# Patient Record
Sex: Male | Born: 1975
Health system: Southern US, Community
[De-identification: ages and names within clinical notes are randomized; demographics above are authoritative.]

## PROBLEM LIST (undated history)

## (undated) DIAGNOSIS — K219 Gastro-esophageal reflux disease without esophagitis: Secondary | ICD-10-CM

## (undated) DIAGNOSIS — R079 Chest pain, unspecified: Secondary | ICD-10-CM

## (undated) DIAGNOSIS — E039 Hypothyroidism, unspecified: Secondary | ICD-10-CM

## (undated) DIAGNOSIS — K4090 Unilateral inguinal hernia, without obstruction or gangrene, not specified as recurrent: Secondary | ICD-10-CM

## (undated) DIAGNOSIS — G4730 Sleep apnea, unspecified: Secondary | ICD-10-CM

## (undated) DIAGNOSIS — Z87442 Personal history of urinary calculi: Secondary | ICD-10-CM

---

## 2006-11-18 HISTORY — PX: LASIK: SHX215

## 2012-09-10 ENCOUNTER — Other Ambulatory Visit: Payer: Self-pay | Admitting: Internal Medicine

## 2012-09-10 DIAGNOSIS — E039 Hypothyroidism, unspecified: Secondary | ICD-10-CM

## 2012-09-14 ENCOUNTER — Ambulatory Visit
Admission: RE | Admit: 2012-09-14 | Discharge: 2012-09-14 | Disposition: A | Discharge status: Home / Self Care | Payer: BC Managed Care – PPO | Source: Ambulatory Visit | Attending: Internal Medicine | Admitting: Internal Medicine

## 2012-09-14 DIAGNOSIS — E039 Hypothyroidism, unspecified: Secondary | ICD-10-CM

## 2013-10-26 ENCOUNTER — Encounter: Payer: Self-pay | Admitting: Internal Medicine

## 2013-10-27 ENCOUNTER — Ambulatory Visit (INDEPENDENT_AMBULATORY_CARE_PROVIDER_SITE_OTHER): Payer: BC Managed Care – PPO | Admitting: Emergency Medicine

## 2013-10-27 ENCOUNTER — Encounter: Payer: Self-pay | Admitting: Emergency Medicine

## 2013-10-27 VITALS — BP 108/62 | HR 72 | Temp 98.2°F | Resp 18 | Ht 71.0 in | Wt 175.0 lb

## 2013-10-27 DIAGNOSIS — Z79899 Other long term (current) drug therapy: Secondary | ICD-10-CM

## 2013-10-27 DIAGNOSIS — E559 Vitamin D deficiency, unspecified: Secondary | ICD-10-CM

## 2013-10-27 DIAGNOSIS — E291 Testicular hypofunction: Secondary | ICD-10-CM

## 2013-10-27 DIAGNOSIS — E782 Mixed hyperlipidemia: Secondary | ICD-10-CM

## 2013-10-27 DIAGNOSIS — E079 Disorder of thyroid, unspecified: Secondary | ICD-10-CM

## 2013-10-27 DIAGNOSIS — R300 Dysuria: Secondary | ICD-10-CM

## 2013-10-27 DIAGNOSIS — E781 Pure hyperglyceridemia: Secondary | ICD-10-CM

## 2013-10-27 DIAGNOSIS — N200 Calculus of kidney: Secondary | ICD-10-CM | POA: Insufficient documentation

## 2013-10-27 DIAGNOSIS — E039 Hypothyroidism, unspecified: Secondary | ICD-10-CM

## 2013-10-27 LAB — CBC WITH DIFFERENTIAL/PLATELET
Basophils Absolute: 0 10*3/uL (ref 0.0–0.1)
Basophils Relative: 1 % (ref 0–1)
Eosinophils Absolute: 0.1 10*3/uL (ref 0.0–0.7)
Hemoglobin: 15.8 g/dL (ref 13.0–17.0)
Lymphs Abs: 1.6 10*3/uL (ref 0.7–4.0)
MCH: 30.9 pg (ref 26.0–34.0)
MCHC: 34.4 g/dL (ref 30.0–36.0)
Monocytes Relative: 9 % (ref 3–12)
Neutro Abs: 3.4 10*3/uL (ref 1.7–7.7)
Neutrophils Relative %: 60 % (ref 43–77)
Platelets: 255 10*3/uL (ref 150–400)
RBC: 5.12 MIL/uL (ref 4.22–5.81)
WBC: 5.7 10*3/uL (ref 4.0–10.5)

## 2013-10-27 LAB — BASIC METABOLIC PANEL WITH GFR
CO2: 31 mEq/L (ref 19–32)
Calcium: 10.3 mg/dL (ref 8.4–10.5)
Chloride: 102 mEq/L (ref 96–112)
Glucose, Bld: 73 mg/dL (ref 70–99)
Sodium: 140 mEq/L (ref 135–145)

## 2013-10-27 LAB — HEPATIC FUNCTION PANEL
ALT: 39 U/L (ref 0–53)
AST: 31 U/L (ref 0–37)
Alkaline Phosphatase: 80 U/L (ref 39–117)
Bilirubin, Direct: 0.1 mg/dL (ref 0.0–0.3)

## 2013-10-27 LAB — TSH: TSH: 4.232 u[IU]/mL (ref 0.350–4.500)

## 2013-10-27 LAB — LIPID PANEL
Cholesterol: 171 mg/dL (ref 0–200)
LDL Cholesterol: 85 mg/dL (ref 0–99)
VLDL: 36 mg/dL (ref 0–40)

## 2013-10-27 LAB — URIC ACID: Uric Acid, Serum: 5.6 mg/dL (ref 4.0–7.8)

## 2013-10-27 MED ORDER — HYDROCODONE-ACETAMINOPHEN 5-325 MG PO TABS: 1.0000 | ORAL_TABLET | Freq: Four times a day (QID) | ORAL | Status: DC | PRN

## 2013-10-27 MED ORDER — TAMSULOSIN HCL 0.4 MG PO CAPS: 0.4000 mg | ORAL_CAPSULE | Freq: Every day | ORAL | Status: DC

## 2013-10-27 MED ORDER — CIPROFLOXACIN HCL 500 MG PO TABS: 500.0000 mg | ORAL_TABLET | Freq: Two times a day (BID) | ORAL | Status: AC

## 2013-10-27 NOTE — Patient Instructions (Signed)
Kidney Stones Kidney stones (urolithiasis) are solid masses that form inside your kidneys. The intense pain is caused by the stone moving through the kidney, ureter, bladder, and urethra (urinary tract). When the stone moves, the ureter starts to spasm around the stone. The stone is usually passed in your pee (urine).  HOME CARE  Drink enough fluids to keep your pee clear or pale yellow. This helps to get the stone out.  Strain all pee through the provided strainer. Do not pee without peeing through the strainer, not even once. If you pee the stone out, catch it in the strainer. The stone may be as small as a grain of salt. Take this to your doctor. This will help your doctor figure out what you can do to try to prevent more kidney stones.  Only take medicine as told by your doctor.  Follow up with your doctor as told.  Get follow-up X-rays as told by your doctor. GET HELP IF: You have pain that gets worse even if you have been taking pain medicine. GET HELP RIGHT AWAY IF:   Your pain does not get better with medicine.  You have a fever or shaking chills.  Your pain increases and gets worse over 18 hours.  You have new belly (abdominal) pain.  You feel faint or pass out.  You are unable to pee. MAKE SURE YOU:   Understand these instructions.  Will watch your condition.  Will get help right away if you are not doing well or get worse. Document Released: 04/22/2008 Document Revised: 07/07/2013 Document Reviewed: 04/07/2013 [employer] Patient Information 2014 [employer].  

## 2013-10-28 ENCOUNTER — Other Ambulatory Visit: Payer: Self-pay | Admitting: Emergency Medicine

## 2013-10-28 DIAGNOSIS — N200 Calculus of kidney: Secondary | ICD-10-CM

## 2013-10-28 LAB — URINALYSIS, ROUTINE W REFLEX MICROSCOPIC
Ketones, ur: NEGATIVE mg/dL
Leukocytes, UA: NEGATIVE
Nitrite: NEGATIVE
Protein, ur: NEGATIVE mg/dL
Urobilinogen, UA: 0.2 mg/dL (ref 0.0–1.0)

## 2013-10-28 LAB — URINALYSIS, MICROSCOPIC ONLY
Bacteria, UA: NONE SEEN
Casts: NONE SEEN
Crystals: NONE SEEN
RBC / HPF: >50 RBC/hpf — AB (ref <3)
Squamous Epithelial / LPF: NONE SEEN

## 2013-10-28 NOTE — Progress Notes (Signed)
   Subjective:    Patient ID: Ryan Lam, male    DOB: 25-Nov-1975, 37 y.o.   MRN: [id number]  HPI Comments: 37 yo male with history of kidney stones in past presents with dysuria x 1 week and mild pain. He brought a old stone with him that appears light brown and with multiple spines approximately 3-76mm. He ednies blood or fever.  He has a history of hypertriglyceridemia and has been doing well with diet and exercise. He has Hypothyroid and is controlled with RX. He also has a history of Hypogonadism but chose no RX with trying to get pregnant. He and wife are now 3 months pregnant and he would like levels rechecked. LAST ABN LABS TG 162 TESTOSTERONE 216  D 48     Current Outpatient Prescriptions on File Prior to Visit  Medication Sig Dispense Refill  . levothyroxine (SYNTHROID, LEVOTHROID) 50 MCG tablet Take 50 mcg by mouth daily before breakfast.      . Magnesium 500 MG CAPS Take 500 mg by mouth daily.      Marland Kitchen omega-3 acid ethyl esters (LOVAZA) 1 G capsule Take by mouth 2 (two) times daily.      . Zinc 25 MG TABS Take 25 mg by mouth daily.       No current facility-administered medications on file prior to visit.   ALLERGIES Penicillins  Past Medical History  Diagnosis Date  . Thyroid disease   . Hypogonadism male   . Kidney stone      Review of Systems  Genitourinary: Positive for dysuria.  All other systems reviewed and are negative.    BP 108/62  Pulse 72  Temp(Src) 98.2 F (36.8 C) (Temporal)  Resp 18  Wt 175 lb (79.379 kg)     Objective:   Physical Exam  Nursing note and vitals reviewed. Constitutional: He is oriented to person, place, and time. He appears well-developed and well-nourished.  HENT:  Head: Normocephalic and atraumatic.  Right Ear: External ear normal.  Left Ear: External ear normal.  Nose: Nose normal.  Eyes: Conjunctivae and EOM are normal.  Neck: Normal range of motion. Neck supple. No JVD present. No thyromegaly present.   Cardiovascular: Normal rate, regular rhythm, normal heart sounds and intact distal pulses.   Pulmonary/Chest: Effort normal and breath sounds normal.  Abdominal: Soft. Bowel sounds are normal. He exhibits no distension and no mass. There is tenderness. There is no rebound and no guarding.  suprapubic  Musculoskeletal: Normal range of motion. He exhibits no edema and no tenderness.  Lymphadenopathy:    He has no cervical adenopathy.  Neurological: He is alert and oriented to person, place, and time. He has normal reflexes. No cranial nerve deficit. Coordination normal.  Skin: Skin is warm and dry.  Psychiatric: He has a normal mood and affect. His behavior is normal. Judgment and thought content normal.          Assessment & Plan:  1. Dysuria with history of kidney stones- Check labs get CT renal focus may need Urology Referral. Cipro 500 mg, Flomax, Norco 5/325 All AD. 2. Hypertriglyceridemia/ Hypogonadism/ Hypothyroid- recheck labs

## 2013-11-01 ENCOUNTER — Other Ambulatory Visit: Payer: Self-pay | Admitting: Emergency Medicine

## 2013-11-01 ENCOUNTER — Ambulatory Visit
Admission: RE | Admit: 2013-11-01 | Discharge: 2013-11-01 | Disposition: A | Discharge status: Home / Self Care | Payer: BC Managed Care – PPO | Source: Ambulatory Visit | Attending: Emergency Medicine | Admitting: Emergency Medicine

## 2013-11-01 DIAGNOSIS — N200 Calculus of kidney: Secondary | ICD-10-CM

## 2013-11-02 ENCOUNTER — Other Ambulatory Visit: Payer: Self-pay | Admitting: Internal Medicine

## 2013-11-02 DIAGNOSIS — N281 Cyst of kidney, acquired: Secondary | ICD-10-CM

## 2013-11-02 DIAGNOSIS — N200 Calculus of kidney: Secondary | ICD-10-CM

## 2013-12-06 ENCOUNTER — Telehealth: Payer: Self-pay | Admitting: *Deleted

## 2013-12-06 ENCOUNTER — Other Ambulatory Visit: Payer: Self-pay | Admitting: Internal Medicine

## 2013-12-06 NOTE — Telephone Encounter (Signed)
Patient called concerned about elevated liver enzymes at urology.  Patient states he does drink alcohol and has been taking OTC meds for sinus.  Per Loree FeeMelissa Smith, reduce alcohol and increase water and return for OV in 1 month. Appointment scheduled.

## 2014-01-06 ENCOUNTER — Other Ambulatory Visit: Payer: Self-pay | Admitting: Emergency Medicine

## 2014-01-06 ENCOUNTER — Ambulatory Visit: Payer: Self-pay | Admitting: Emergency Medicine

## 2014-04-06 ENCOUNTER — Encounter: Payer: Self-pay | Admitting: Internal Medicine

## 2014-07-14 ENCOUNTER — Other Ambulatory Visit: Payer: Self-pay | Admitting: *Deleted

## 2014-07-14 NOTE — Telephone Encounter (Signed)
Patient called and advised [place] the [place] and forgot his levothyroxine 50 mcg, called # [phone]  To [facility] [phone]

## 2014-08-23 ENCOUNTER — Other Ambulatory Visit: Payer: Self-pay | Admitting: Emergency Medicine

## 2014-09-29 ENCOUNTER — Encounter: Payer: Self-pay | Admitting: Internal Medicine

## 2014-09-29 ENCOUNTER — Ambulatory Visit (INDEPENDENT_AMBULATORY_CARE_PROVIDER_SITE_OTHER): Payer: BC Managed Care – PPO | Admitting: Internal Medicine

## 2014-09-29 VITALS — BP 126/78 | HR 72 | Temp 98.1°F | Resp 16 | Ht 71.0 in | Wt 184.6 lb

## 2014-09-29 DIAGNOSIS — E349 Endocrine disorder, unspecified: Secondary | ICD-10-CM | POA: Insufficient documentation

## 2014-09-29 DIAGNOSIS — Z0001 Encounter for general adult medical examination with abnormal findings: Secondary | ICD-10-CM

## 2014-09-29 DIAGNOSIS — R6889 Other general symptoms and signs: Secondary | ICD-10-CM

## 2014-09-29 DIAGNOSIS — IMO0001 Reserved for inherently not codable concepts without codable children: Secondary | ICD-10-CM

## 2014-09-29 DIAGNOSIS — R945 Abnormal results of liver function studies: Secondary | ICD-10-CM

## 2014-09-29 DIAGNOSIS — E559 Vitamin D deficiency, unspecified: Secondary | ICD-10-CM | POA: Insufficient documentation

## 2014-09-29 DIAGNOSIS — Z1212 Encounter for screening for malignant neoplasm of rectum: Secondary | ICD-10-CM

## 2014-09-29 DIAGNOSIS — R7989 Other specified abnormal findings of blood chemistry: Secondary | ICD-10-CM

## 2014-09-29 DIAGNOSIS — N200 Calculus of kidney: Secondary | ICD-10-CM

## 2014-09-29 DIAGNOSIS — E039 Hypothyroidism, unspecified: Secondary | ICD-10-CM | POA: Insufficient documentation

## 2014-09-29 DIAGNOSIS — Z113 Encounter for screening for infections with a predominantly sexual mode of transmission: Secondary | ICD-10-CM

## 2014-09-29 DIAGNOSIS — E782 Mixed hyperlipidemia: Secondary | ICD-10-CM

## 2014-09-29 DIAGNOSIS — R030 Elevated blood-pressure reading, without diagnosis of hypertension: Secondary | ICD-10-CM

## 2014-09-29 DIAGNOSIS — Z79899 Other long term (current) drug therapy: Secondary | ICD-10-CM | POA: Insufficient documentation

## 2014-09-29 DIAGNOSIS — E038 Other specified hypothyroidism: Secondary | ICD-10-CM

## 2014-09-29 DIAGNOSIS — Z23 Encounter for immunization: Secondary | ICD-10-CM

## 2014-09-29 LAB — CBC WITH DIFFERENTIAL/PLATELET
Basophils Absolute: 0 10*3/uL (ref 0.0–0.1)
Basophils Relative: 1 % (ref 0–1)
EOS PCT: 2 % (ref 0–5)
Eosinophils Absolute: 0.1 10*3/uL (ref 0.0–0.7)
HEMATOCRIT: 43.4 % (ref 39.0–52.0)
Hemoglobin: 14.9 g/dL (ref 13.0–17.0)
Lymphocytes Relative: 32 % (ref 12–46)
Lymphs Abs: 1.5 10*3/uL (ref 0.7–4.0)
MCH: 31 pg (ref 26.0–34.0)
MCHC: 34.3 g/dL (ref 30.0–36.0)
MCV: 90.2 fL (ref 78.0–100.0)
MONO ABS: 0.5 10*3/uL (ref 0.1–1.0)
Monocytes Relative: 10 % (ref 3–12)
Neutro Abs: 2.6 10*3/uL (ref 1.7–7.7)
Neutrophils Relative %: 55 % (ref 43–77)
PLATELETS: 238 10*3/uL (ref 150–400)
RBC: 4.81 MIL/uL (ref 4.22–5.81)
RDW: 13.1 % (ref 11.5–15.5)
WBC: 4.8 10*3/uL (ref 4.0–10.5)

## 2014-09-29 NOTE — Progress Notes (Signed)
Patient ID: Ryan Lam, male   DOB: 02/15/1976, 38 y.o.   MRN: [id number]  Annual Screening Comprehensive Examination  This very nice 38 y.o.male presents for complete physical.  Patient has been monitored for HTN,  Hyperlipidemia, Testosterone and Vitamin D Deficiency. Also patient has been on Thyroid Replacement since July 2012.   Patient is monitored expectantly for HTN. Patient's BP has been controlled at home.Today's BP: 126/78 mmHg. Patient denies any cardiac symptoms as chest pain, palpitations, shortness of breath, dizziness or ankle swelling.   Patient's hyperlipidemia is controlled with diet. Last lipids were at goal with Total Chol 171; HDL  50; LDL  85; and elevated Trig 182 on 10/27/2013.   Patient is screened for prediabetes denies reactive hypoglycemic symptoms, visual blurring, diabetic polys or paresthesias. Last A1c was  5.1% in May 2013.   Patient has hx/o Low T with level of 172 in Mar 2014 and 223 in Mar 2013.  Patient's wife is in 3rd trimester of pregnancy so obviously he is functional and virile. Finally, patient has history of Vitamin D Deficiency of 56 in 2013 and last vitamin D was 60 on  10/27/2013.  Medication Sig  . VITAMIN D2000 UNITS Take 2,000 Units by mouth daily.  Awanda Mink. NORCO 5-325  Take 1 tablet  every 6 hours as needed kidney stones  . levothyroxine  50 MCG tablet 1 TAB DAILY  . Magnesium 500 MG CAPS Take  daily.  . Zinc 25 MG TABS Take  daily.   Allergies  Allergen Reactions  . Penicillins     Past Medical History  Diagnosis Date  . Thyroid disease   . Hypogonadism male   . Kidney stone    Health Maintenance  Topic Date Due  . INFLUENZA VACCINE  06/18/2014  . TETANUS/TDAP  01/15/2022   Immunization History  Administered Date(s) Administered  . Pneumococcal Polysaccharide-23 01/16/2012  . Tdap 01/16/2012   Past Surgical History  Procedure Laterality Date  . Lasik  2008   Family History  Problem Relation Age of Onset  . Cancer Mother      hx breast  . COPD Father   . AAA (abdominal aortic aneurysm) Father    History   Social History  . Marital Status: Married    Spouse Name: N/A    Number of Children: N/A  . Years of Education: N/A   Occupational History  . 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 Smoking status: Never Smoker   . Smokeless tobacco: Not on file  . Alcohol Use: 6.0 oz/week    10 Not specified per week  . Drug Use: No  . Sexual Activity: Active    ROS Constitutional: Denies fever, chills, weight loss/gain, headaches, insomnia, fatigue, night sweats or change in appetite. Eyes: Denies redness, blurred vision, diplopia, discharge, itchy or watery eyes.  ENT: Denies discharge, congestion, post nasal drip, epistaxis, sore throat, earache, hearing loss, dental pain, Tinnitus, Vertigo, Sinus pain or snoring.  Cardio: Denies chest pain, palpitations, irregular heartbeat, syncope, dyspnea, diaphoresis, orthopnea, PND, claudication or edema Respiratory: denies cough, dyspnea, DOE, pleurisy, hoarseness, laryngitis or wheezing.  Gastrointestinal: Denies dysphagia, heartburn, reflux, water brash, pain, cramps, nausea, vomiting, bloating, diarrhea, constipation, hematemesis, melena, hematochezia, jaundice or hemorrhoids Genitourinary: Denies dysuria, frequency, urgency, nocturia, hesitancy, discharge, hematuria or flank pain Musculoskeletal: Denies arthralgia, myalgia, stiffness, Jt. Swelling, pain, limp or strain/sprain. Denies Falls. Skin: Denies puritis, rash, hives, warts, acne, eczema or change in skin lesion Neuro:  No weakness, tremor, incoordination, spasms, paresthesia or pain Psychiatric: Denies confusion, memory loss or sensory loss. Denies Depression. Endocrine: Denies change in weight, skin, hair change, nocturia, and paresthesia, diabetic polys, visual blurring or hyper / hypo glycemic episodes.  Heme/Lymph: No excessive bleeding, bruising or enlarged lymph  nodes.  Physical Exam  BP 126/78   Pulse 72  Temp 98.1 F   Resp 16  Ht 5\' 11"    Wt 184 lb 9.6 oz   BMI 25.76   General Appearance: Well nourished, in no apparent distress. Eyes: PERRLA, EOMs, conjunctiva no swelling or erythema, normal fundi and vessels. Sinuses: No frontal/maxillary tenderness ENT/Mouth: EACs patent / TMs  nl. Nares clear without erythema, swelling, mucoid exudates. Oral hygiene is good. No erythema, swelling, or exudate. Tongue normal, non-obstructing. Tonsils not swollen or erythematous. Hearing normal.  Neck: Supple, thyroid normal. No bruits, nodes or JVD. Respiratory: Respiratory effort normal.  BS equal and clear bilateral without rales, rhonci, wheezing or stridor. Cardio: Heart sounds are normal with regular rate and rhythm and no murmurs, rubs or gallops. Peripheral pulses are normal and equal bilaterally without edema. No aortic or femoral bruits. Chest: symmetric with normal excursions and percussion.  Abdomen: Flat, soft, with bowl sounds. Nontender, no guarding, rebound, hernias, masses, or organomegaly.  Lymphatics: Non tender without lymphadenopathy.  Genitourinary: No hernias.Testes nl. DRE - prostate nl for age - smooth & firm w/o nodules. Musculoskeletal: Full ROM all peripheral extremities, joint stability, 5/5 strength, and normal gait. Skin: Warm and dry without rashes, lesions, cyanosis, clubbing or  ecchymosis.  Neuro: Cranial nerves intact, reflexes equal bilaterally. Normal muscle tone, no cerebellar symptoms. Sensation intact.  Pysch: Awake and oriented X 3with normal affect, insight and judgment appropriate.   Assessment and Plan  1. Annual Screening Examination 2. Screening for elevated BP 3. Hyperlipidemia (Trig) 4. Pre Diabetes (Screening) 5. Vitamin D Deficiency 6. Testosterone Deficiency   Continue prudent diet as discussed, weight control, BP monitoring, regular exercise, and medications as discussed.  Discussed med effects and  SE's. Routine screening labs and tests as requested with regular follow-up as recommended.

## 2014-09-29 NOTE — Patient Instructions (Signed)
 Recommend the book "The END of DIETING" by Dr Joel Furman   and the book "The END of DIABETES " by Dr Joel Fuhrman  At [employer] - get book & Audio CD's      Being diabetic has a  300% increased risk for heart attack, stroke, cancer, and alzheimer- type vascular dementia. It is very important that you work harder with diet by avoiding all foods that are white except chicken & fish. Avoid white rice (brown & wild rice is OK), white potatoes (sweetpotatoes in moderation is OK), White bread or wheat bread or anything made out of white flour like bagels, donuts, rolls, buns, biscuits, cakes, pastries, cookies, pizza crust, and pasta (made from white flour & egg whites) - vegetarian pasta or spinach or wheat pasta is OK. Multigrain breads like Arnold's or Pepperidge Farm, or multigrain sandwich thins or flatbreads.  Diet, exercise and weight loss can reverse and cure diabetes in the early stages.  Diet, exercise and weight loss is very important in the control and prevention of complications of diabetes which affects every system in your body, ie. Brain - dementia/stroke, eyes - glaucoma/blindness, heart - heart attack/heart failure, kidneys - dialysis, stomach - gastric paralysis, intestines - malabsorption, nerves - severe painful neuritis, circulation - gangrene & loss of a leg(s), and finally cancer and Alzheimers.    I recommend avoid fried & greasy foods,  sweets/candy, white rice (brown or wild rice or Quinoa is OK), white potatoes (sweet potatoes are OK) - anything made from white flour - bagels, doughnuts, rolls, buns, biscuits,white and wheat breads, pizza crust and traditional pasta made of white flour & egg white(vegetarian pasta or spinach or wheat pasta is OK).  Multi-grain bread is OK - like multi-grain flat bread or sandwich thins. Avoid alcohol in excess. Exercise is also important.    Eat all the vegetables you want - avoid meat, especially red meat and dairy - especially cheese.  Cheese  is the most concentrated form of trans-fats which is the worst thing to clog up our arteries. Veggie cheese is OK which can be found in the fresh produce section at [employer] or [employer] or [employer]  Preventive Care for Adults A healthy lifestyle and preventive care can promote health and wellness. Preventive health guidelines for men include the following key practices:  A routine yearly physical is a good way to check with your health care provider about your health and preventative screening. It is a chance to share any concerns and updates on your health and to receive a thorough exam.  Visit your dentist for a routine exam and preventative care every 6 months. Brush your teeth twice a day and floss once a day. Good oral hygiene prevents tooth decay and gum disease.  The frequency of eye exams is based on your age, health, family medical history, use of contact lenses, and other factors. Follow your health care provider's recommendations for frequency of eye exams.  Eat a healthy diet. Foods such as vegetables, fruits, whole grains, low-fat dairy products, and lean protein foods contain the nutrients you need without too many calories. Decrease your intake of foods high in solid fats, added sugars, and salt. Eat the right amount of calories for you.Get information about a proper diet from your health care provider, if necessary.  Regular physical exercise is one of the most important things you can do for your health. Most adults should get at least 150 minutes of moderate-intensity exercise (any activity that   increases your heart rate and causes you to sweat) each week. In addition, most adults need muscle-strengthening exercises on 2 or more days a week.  Maintain a healthy weight. The body mass index (BMI) is a screening tool to identify possible weight problems. It provides an estimate of body fat based on height and weight. Your health care provider can find your BMI and can help you  achieve or maintain a healthy weight.For adults 20 years and older:  A BMI below 18.5 is considered underweight.  A BMI of 18.5 to 24.9 is normal.  A BMI of 25 to 29.9 is considered overweight.  A BMI of 30 and above is considered obese.  Maintain normal blood lipids and cholesterol levels by exercising and minimizing your intake of saturated fat. Eat a balanced diet with plenty of fruit and vegetables. Blood tests for lipids and cholesterol should begin at age 20 and be repeated every 5 years. If your lipid or cholesterol levels are high, you are over 50, or you are at high risk for heart disease, you may need your cholesterol levels checked more frequently.Ongoing high lipid and cholesterol levels should be treated with medicines if diet and exercise are not working.  If you smoke, find out from your health care provider how to quit. If you do not use tobacco, do not start.  Lung cancer screening is recommended for adults aged 55-80 years who are at high risk for developing lung cancer because of a history of smoking. A yearly low-dose CT scan of the lungs is recommended for people who have at least a 30-pack-year history of smoking and are a current smoker or have quit within the past 15 years. A pack year of smoking is smoking an average of 1 pack of cigarettes a day for 1 year (for example: 1 pack a day for 30 years or 2 packs a day for 15 years). Yearly screening should continue until the smoker has stopped smoking for at least 15 years. Yearly screening should be stopped for people who develop a health problem that would prevent them from having lung cancer treatment.  If you choose to drink alcohol, do not have more than 2 drinks per day. One drink is considered to be 12 ounces (355 mL) of beer, 5 ounces (148 mL) of wine, or 1.5 ounces (44 mL) of liquor.  High blood pressure causes heart disease and increases the risk of stroke. Your blood pressure should be checked. Ongoing high blood  pressure should be treated with medicines, if weight loss and exercise are not effective.  If you are 45-79 years old, ask your health care provider if you should take aspirin to prevent heart disease.  Diabetes screening involves taking a blood sample to check your fasting blood sugar level. Testing should be considered at a younger age or be carried out more frequently if you are overweight and have at least 1 risk factor for diabetes.  Colorectal cancer can be detected and often prevented. Most routine colorectal cancer screening begins at the age of 50 and continues through age 75. However, your health care provider may recommend screening at an earlier age if you have risk factors for colon cancer. On a yearly basis, your health care provider may provide home test kits to check for hidden blood in the stool. Use of a small camera at the end of a tube to directly examine the colon (sigmoidoscopy or colonoscopy) can detect the earliest forms of colorectal cancer. Talk to your   health care provider about this at age 50, when routine screening begins. Direct exam of the colon should be repeated every 5-10 years through age 75, unless early forms of precancerous polyps or small growths are found.  Screening for abdominal aortic aneurysm (AAA)  are recommended for persons over age 50 who have history of hypertensionor who are current or former smokers.  Talk with your health care provider about prostate cancer screening.  Testicular cancer screening is recommended for adult males. Screening includes self-exam, a health care provider exam, and other screening tests. Consult with your health care provider about any symptoms you have or any concerns you have about testicular cancer.  Use sunscreen. Apply sunscreen liberally and repeatedly throughout the day. You should seek shade when your shadow is shorter than you. Protect yourself by wearing long sleeves, pants, a wide-brimmed hat, and sunglasses year  round, whenever you are outdoors.  Once a month, do a whole-body skin exam, using a mirror to look at the skin on your back. Tell your health care provider about new moles, moles that have irregular borders, moles that are larger than a pencil eraser, or moles that have changed in shape or color.  Stay current with required vaccines (immunizations).  Influenza vaccine. All adults should be immunized every year.  Tetanus, diphtheria, and acellular pertussis (Td, Tdap) vaccine. An adult who has not previously received Tdap or who does not know his vaccine status should receive 1 dose of Tdap. This initial dose should be followed by tetanus and diphtheria toxoids (Td) booster doses every 10 years. Adults with an unknown or incomplete history of completing a 3-dose immunization series with Td-containing vaccines should begin or complete a primary immunization series including a Tdap dose. Adults should receive a Td booster every 10 years.  Zoster vaccine. One dose is recommended for adults aged 60 years or older unless certain conditions are present.    Pneumococcal 13-valent conjugate (PCV13) vaccine. When indicated, a person who is uncertain of his immunization history and has no record of immunization should receive the PCV13 vaccine. An adult aged 19 years or older who has certain medical conditions and has not been previously immunized should receive 1 dose of PCV13 vaccine. This PCV13 should be followed with a dose of pneumococcal polysaccharide (PPSV23) vaccine. The PPSV23 vaccine dose should be obtained at least 8 weeks after the dose of PCV13 vaccine. An adult aged 19 years or older who has certain medical conditions and previously received 1 or more doses of PPSV23 vaccine should receive 1 dose of PCV13. The PCV13 vaccine dose should be obtained 1 or more years after the last PPSV23 vaccine dose.    Pneumococcal polysaccharide (PPSV23) vaccine. When PCV13 is also indicated, PCV13 should be  obtained first. All adults aged 65 years and older should be immunized. An adult younger than age 65 years who has certain medical conditions should be immunized. Any person who resides in a nursing home or long-term care facility should be immunized. An adult smoker should be immunized. People with an immunocompromised condition and certain other conditions should receive both PCV13 and PPSV23 vaccines. People with human immunodeficiency virus (HIV) infection should be immunized as soon as possible after diagnosis. Immunization during chemotherapy or radiation therapy should be avoided. Routine use of PPSV23 vaccine is not recommended for American Indians, [place] Natives, or people younger than 65 years unless there are medical conditions that require PPSV23 vaccine. When indicated, people who have unknown immunization and have no record   of immunization should receive PPSV23 vaccine. One-time revaccination 5 years after the first dose of PPSV23 is recommended for people aged 19-64 years who have chronic kidney failure, nephrotic syndrome, asplenia, or immunocompromised conditions. People who received 1-2 doses of PPSV23 before age 65 years should receive another dose of PPSV23 vaccine at age 65 years or later if at least 5 years have passed since the previous dose. Doses of PPSV23 are not needed for people immunized with PPSV23 at or after age 65 years.  Hepatitis A vaccine. Adults who wish to be protected from this disease, have certain high-risk conditions, work with hepatitis A-infected animals, work in hepatitis A research labs, or travel to or work in countries with a high rate of hepatitis A should be immunized. Adults who were previously unvaccinated and who anticipate close contact with an international adoptee during the first 60 days after arrival in the [place] from a country with a high rate of hepatitis A should be immunized.  Hepatitis B vaccine. Adults should be immunized if they wish to be  protected from this disease, have certain high-risk conditions, may be exposed to blood or other infectious body fluids, are household contacts or sex partners of hepatitis B positive people, are clients or workers in certain care facilities, or travel to or work in countries with a high rate of hepatitis B.  Preventive Service / Frequency  Ages 19 to 39  Blood pressure check.  Lipid and cholesterol check.  Hepatitis C blood test.** / For any individual with known risks for hepatitis C.  Skin self-exam. / Monthly.  Influenza vaccine. / Every year.  Tetanus, diphtheria, and acellular pertussis (Tdap, Td) vaccine.** / Consult your health care provider. 1 dose of Td every 10 years.  HPV vaccine. / 3 doses over 6 months, if 26 or younger.  Measles, mumps, rubella (MMR) vaccine.** / You need at least 1 dose of MMR if you were born in 1957 or later. You may also need a second dose.  Pneumococcal 13-valent conjugate (PCV13) vaccine.** / Consult your health care provider.  Pneumococcal polysaccharide (PPSV23) vaccine.** / 1 to 2 doses if you smoke cigarettes or if you have certain conditions.  Meningococcal vaccine.** / 1 dose if you are age 19 to 21 years and a first-year college student living in a residence hall, or have one of several medical conditions. You may also need additional booster doses.  Hepatitis A vaccine.** / Consult your health care provider.  Hepatitis B vaccine.** / Consult your health care provider. 

## 2014-09-30 ENCOUNTER — Encounter: Payer: Self-pay | Admitting: Internal Medicine

## 2014-09-30 DIAGNOSIS — Z23 Encounter for immunization: Secondary | ICD-10-CM

## 2014-09-30 LAB — HEPATIC FUNCTION PANEL
ALK PHOS: 75 U/L (ref 39–117)
ALT: 41 U/L (ref 0–53)
AST: 33 U/L (ref 0–37)
Albumin: 4.3 g/dL (ref 3.5–5.2)
Bilirubin, Direct: 0.1 mg/dL (ref 0.0–0.3)
Indirect Bilirubin: 0.3 mg/dL (ref 0.2–1.2)
TOTAL PROTEIN: 7.3 g/dL (ref 6.0–8.3)
Total Bilirubin: 0.4 mg/dL (ref 0.2–1.2)

## 2014-09-30 LAB — BASIC METABOLIC PANEL WITH GFR
BUN: 14 mg/dL (ref 6–23)
CALCIUM: 9.7 mg/dL (ref 8.4–10.5)
CHLORIDE: 103 meq/L (ref 96–112)
CO2: 32 mEq/L (ref 19–32)
CREATININE: 1 mg/dL (ref 0.50–1.35)
GFR, Est African American: >89 mL/min
Glucose, Bld: 86 mg/dL (ref 70–99)
Potassium: 4.4 mEq/L (ref 3.5–5.3)
Sodium: 140 mEq/L (ref 135–145)

## 2014-09-30 LAB — VITAMIN B12: VITAMIN B 12: 744 pg/mL (ref 211–911)

## 2014-09-30 LAB — URINALYSIS, MICROSCOPIC ONLY
Bacteria, UA: NONE SEEN
CRYSTALS: NONE SEEN
Casts: NONE SEEN
Squamous Epithelial / LPF: NONE SEEN

## 2014-09-30 LAB — LIPID PANEL
Cholesterol: 159 mg/dL (ref 0–200)
HDL: 48 mg/dL (ref >39)
LDL Cholesterol: 86 mg/dL (ref 0–99)
TRIGLYCERIDES: 124 mg/dL (ref <150)
Total CHOL/HDL Ratio: 3.3 Ratio
VLDL: 25 mg/dL (ref 0–40)

## 2014-09-30 LAB — INSULIN, FASTING: INSULIN FASTING, SERUM: 18.6 u[IU]/mL (ref 2.0–19.6)

## 2014-09-30 LAB — MICROALBUMIN / CREATININE URINE RATIO
Creatinine, Urine: 178.2 mg/dL
MICROALB/CREAT RATIO: 7.9 mg/g (ref 0.0–30.0)
Microalb, Ur: 1.4 mg/dL (ref <2.0)

## 2014-09-30 LAB — HIV ANTIBODY (ROUTINE TESTING W REFLEX): HIV 1&2 Ab, 4th Generation: NONREACTIVE

## 2014-09-30 LAB — MAGNESIUM: MAGNESIUM: 2.1 mg/dL (ref 1.5–2.5)

## 2014-09-30 LAB — VITAMIN D 25 HYDROXY (VIT D DEFICIENCY, FRACTURES): VIT D 25 HYDROXY: 58 ng/mL (ref 30–89)

## 2014-09-30 LAB — HEPATITIS B SURFACE ANTIBODY,QUALITATIVE: Hep B S Ab: NEGATIVE

## 2014-09-30 LAB — HEMOGLOBIN A1C
Hgb A1c MFr Bld: 5 % (ref <5.7)
MEAN PLASMA GLUCOSE: 97 mg/dL (ref <117)

## 2014-09-30 LAB — TSH: TSH: 3.337 u[IU]/mL (ref 0.350–4.500)

## 2014-09-30 LAB — TESTOSTERONE: Testosterone: 127 ng/dL — ABNORMAL LOW (ref 300–890)

## 2014-09-30 LAB — RPR

## 2014-09-30 LAB — HEPATITIS A ANTIBODY, TOTAL: Hep A Total Ab: NONREACTIVE

## 2014-09-30 LAB — HEPATITIS C ANTIBODY: HCV Ab: NEGATIVE

## 2014-09-30 LAB — HEPATITIS B CORE ANTIBODY, TOTAL: HEP B C TOTAL AB: NONREACTIVE

## 2014-10-01 ENCOUNTER — Encounter: Payer: Self-pay | Admitting: Internal Medicine

## 2014-10-03 LAB — HEPATITIS B E ANTIBODY: Hepatitis Be Antibody: NONREACTIVE

## 2014-10-08 ENCOUNTER — Other Ambulatory Visit: Payer: Self-pay | Admitting: Physician Assistant

## 2014-10-19 ENCOUNTER — Other Ambulatory Visit: Payer: Self-pay | Admitting: *Deleted

## 2014-10-19 DIAGNOSIS — Z1212 Encounter for screening for malignant neoplasm of rectum: Secondary | ICD-10-CM

## 2014-10-19 LAB — POC HEMOCCULT BLD/STL (HOME/3-CARD/SCREEN)
Card #2 Fecal Occult Blod, POC: NEGATIVE
Card #3 Fecal Occult Blood, POC: NEGATIVE
Fecal Occult Blood, POC: NEGATIVE

## 2015-04-10 ENCOUNTER — Encounter: Payer: Self-pay | Admitting: Internal Medicine

## 2015-10-17 ENCOUNTER — Encounter: Payer: Self-pay | Admitting: Internal Medicine

## 2015-10-23 ENCOUNTER — Ambulatory Visit: Payer: Self-pay | Admitting: Internal Medicine

## 2015-10-23 NOTE — Progress Notes (Signed)
Patient ID: Ryan Lam, male   DOB: 07/28/1976, 39 y.o.   MRN: [id number] [facility]

## 2015-12-11 ENCOUNTER — Ambulatory Visit (INDEPENDENT_AMBULATORY_CARE_PROVIDER_SITE_OTHER): Payer: BLUE CROSS/BLUE SHIELD | Admitting: Internal Medicine

## 2015-12-11 ENCOUNTER — Encounter: Payer: Self-pay | Admitting: Internal Medicine

## 2015-12-11 VITALS — BP 118/86 | HR 72 | Temp 97.5°F | Resp 16 | Ht 71.0 in | Wt 185.0 lb

## 2015-12-11 DIAGNOSIS — Z0001 Encounter for general adult medical examination with abnormal findings: Secondary | ICD-10-CM

## 2015-12-11 DIAGNOSIS — E559 Vitamin D deficiency, unspecified: Secondary | ICD-10-CM

## 2015-12-11 DIAGNOSIS — E039 Hypothyroidism, unspecified: Secondary | ICD-10-CM

## 2015-12-11 DIAGNOSIS — Z111 Encounter for screening for respiratory tuberculosis: Secondary | ICD-10-CM | POA: Diagnosis not present

## 2015-12-11 DIAGNOSIS — IMO0001 Reserved for inherently not codable concepts without codable children: Secondary | ICD-10-CM

## 2015-12-11 DIAGNOSIS — R7309 Other abnormal glucose: Secondary | ICD-10-CM

## 2015-12-11 DIAGNOSIS — Z1212 Encounter for screening for malignant neoplasm of rectum: Secondary | ICD-10-CM

## 2015-12-11 DIAGNOSIS — E349 Endocrine disorder, unspecified: Secondary | ICD-10-CM

## 2015-12-11 DIAGNOSIS — I10 Essential (primary) hypertension: Secondary | ICD-10-CM | POA: Insufficient documentation

## 2015-12-11 DIAGNOSIS — Z79899 Other long term (current) drug therapy: Secondary | ICD-10-CM

## 2015-12-11 DIAGNOSIS — R5383 Other fatigue: Secondary | ICD-10-CM

## 2015-12-11 DIAGNOSIS — E782 Mixed hyperlipidemia: Secondary | ICD-10-CM

## 2015-12-11 DIAGNOSIS — Z0000 Encounter for general adult medical examination without abnormal findings: Secondary | ICD-10-CM

## 2015-12-11 DIAGNOSIS — R030 Elevated blood-pressure reading, without diagnosis of hypertension: Secondary | ICD-10-CM

## 2015-12-11 LAB — CBC WITH DIFFERENTIAL/PLATELET
Basophils Absolute: 0 10*3/uL (ref 0.0–0.1)
Basophils Relative: 1 % (ref 0–1)
Eosinophils Absolute: 0.1 10*3/uL (ref 0.0–0.7)
Eosinophils Relative: 2 % (ref 0–5)
HEMATOCRIT: 44.2 % (ref 39.0–52.0)
HEMOGLOBIN: 14.8 g/dL (ref 13.0–17.0)
LYMPHS ABS: 1.5 10*3/uL (ref 0.7–4.0)
LYMPHS PCT: 36 % (ref 12–46)
MCH: 30.9 pg (ref 26.0–34.0)
MCHC: 33.5 g/dL (ref 30.0–36.0)
MCV: 92.3 fL (ref 78.0–100.0)
MONOS PCT: 17 % — AB (ref 3–12)
MPV: 10.3 fL (ref 8.6–12.4)
Monocytes Absolute: 0.7 10*3/uL (ref 0.1–1.0)
NEUTROS ABS: 1.8 10*3/uL (ref 1.7–7.7)
NEUTROS PCT: 44 % (ref 43–77)
Platelets: 215 10*3/uL (ref 150–400)
RBC: 4.79 MIL/uL (ref 4.22–5.81)
RDW: 13.3 % (ref 11.5–15.5)
WBC: 4.2 10*3/uL (ref 4.0–10.5)

## 2015-12-11 MED ORDER — FISH OIL 1200 MG PO CAPS: ORAL_CAPSULE | ORAL | Status: DC

## 2015-12-11 NOTE — Progress Notes (Signed)
Patient ID: Ryan Lam, male   DOB: [birth year], 40 y.o.   MRN: [id number]  Annual  Screening/Preventative Visit And Comprehensive Evaluation & Examination      This very nice 40 y.o. MWM presents for presents for a Wellness/Preventative Visit & comprehensive evaluation and management of multiple medical co-morbidities.  Patient is being screened for elevated BP,  Prediabetes, Hyperlipidemia, Low Testosterone, Thyroid  and Vitamin D Deficiency.      Patient has had borderline elevated BP's in th past and today's BP: 118/86 mmHg. Patient denies any cardiac symptoms as chest pain, palpitations, shortness of breath, dizziness or ankle swelling.      Patient's hyperlipidemia is controlled with diet and medications. Patient denies myalgias or other medication SE's. Last lipids were Total Chol 159, HDL 48, Trig 124 & LDL 86 in Nov 2015.      Patient is screened proactively for prediabetes and patient denies reactive hypoglycemic symptoms, visual blurring, diabetic polys or paresthesias. Last A1c was 5.0% in Nov 2015.        Patient has been on thyroid replacement since July 2013. Patient has several yr hx/o Low Testosterone with values ranging from 172, 158 to 127 last Nov 2015. He denies any sx's consequent of low testosterone as fatigue, decreased libido, ED or mood changes as depression and thus has deferred replacement therapy.  Finally, patient has history of Vitamin D Deficiency and last vitamin D was 52 in Nov 2015.   Medication Sig  . VITAMIN D 2000 UNITS  Take 2,000 Units by mouth daily.  Marland Kitchen levothyroxine 50 MCG tablet Take 1 tablet daily on an empty stomach for 30 minutes  . Magnesium 500 MG CAPS Take 500 mg by mouth daily.   OTC Omega 3 Fish Oil  Takes 2 to 3 x/week  . Zinc 25 MG TABS Take 25 mg by mouth daily.   Allergies  Allergen Reactions  . Penicillins    Past Medical History  Diagnosis Date  . Thyroid disease   . Hypogonadism male   . Kidney stone    Health Maintenance   Topic Date Due  . INFLUENZA VACCINE  06/19/2015  . TETANUS/TDAP  01/15/2022  . HIV Screening  Completed   Immunization History  Administered Date(s) Administered  . Influenza Split 09/30/2014  . Pneumococcal Polysaccharide-23 01/16/2012  . Tdap 01/16/2012   Past Surgical History  Procedure Laterality Date  . Lasik  2008   Family History  Problem Relation Age of Onset  . Cancer Mother     hx breast  . COPD Father   . AAA (abdominal aortic aneurysm) Father     Social History   Social History  . Marital Status: Married    Spouse Name: N/A  . Number of Children: 1 daughter 94 yo & wife 6 mo Gravid w/a 2sd daughter  . Years of Education: N/A   Occupational History  . Barrister's clerk   Social History Main Topics  . Smoking status: Never Smoker   . Smokeless tobacco: Not on file  . Alcohol Use: 6.0 oz/week    10 Standard drinks or equivalent per week  . Drug Use: No  . Sexual Activity: QActive     ROS Constitutional: Denies fever, chills, weight loss/gain, headaches, insomnia,  night sweats or change in appetite. Does c/o fatigue. Eyes: Denies redness, blurred vision, diplopia, discharge, itchy or watery eyes.  ENT: Denies discharge, congestion, post nasal drip, epistaxis, sore throat, earache, hearing loss, dental pain, Tinnitus,  Vertigo, Sinus pain or snoring.  Cardio: Denies chest pain, palpitations, irregular heartbeat, syncope, dyspnea, diaphoresis, orthopnea, PND, claudication or edema Respiratory: denies cough, dyspnea, DOE, pleurisy, hoarseness, laryngitis or wheezing.  Gastrointestinal: Denies dysphagia, heartburn, reflux, water brash, pain, cramps, nausea, vomiting, bloating, diarrhea, constipation, hematemesis, melena, hematochezia, jaundice or hemorrhoids Genitourinary: Denies dysuria, frequency, urgency, nocturia, hesitancy, discharge, hematuria or flank pain Musculoskeletal: Denies arthralgia, myalgia, stiffness, Jt. Swelling, pain,  limp or strain/sprain. Denies Falls. Skin: Denies puritis, rash, hives, warts, acne, eczema or change in skin lesion Neuro: No weakness, tremor, incoordination, spasms, paresthesia or pain Psychiatric: Denies confusion, memory loss or sensory loss. Denies Depression. Endocrine: Denies change in weight, skin, hair change, nocturia, and paresthesia, diabetic polys, visual blurring or hyper / hypo glycemic episodes.  Heme/Lymph: No excessive bleeding, bruising or enlarged lymph nodes.  Physical Exam  BP 118/86 mmHg  Pulse 72  Temp(Src) 97.5 F (36.4 C)  Resp 16  Ht  (1.803 m)  Wt 185 lb (83.915 kg)  BMI 25.81 kg/m2  General Appearance: Well nourished, in no apparent distress. Eyes: PERRLA, EOMs, conjunctiva no swelling or erythema, normal fundi and vessels. Sinuses: No frontal/maxillary tenderness ENT/Mouth: EACs patent / TMs  nl. Nares clear without erythema, swelling, mucoid exudates. Oral hygiene is good. No erythema, swelling, or exudate. Tongue normal, non-obstructing. Tonsils not swollen or erythematous. Hearing normal.  Neck: Supple, thyroid normal. No bruits, nodes or JVD. Respiratory: Respiratory effort normal.  BS equal and clear bilateral without rales, rhonci, wheezing or stridor. Cardio: Heart sounds are normal with regular rate and rhythm and no murmurs, rubs or gallops. Peripheral pulses are normal and equal bilaterally without edema. No aortic or femoral bruits. Chest: symmetric with normal excursions and percussion.  Abdomen: Soft, with Nl bowel sounds. Nontender, no guarding, rebound, hernias, masses, or organomegaly.  Lymphatics: Non tender without lymphadenopathy.  Genitourinary: No hernias.Testes nl. DRE - prostate nl for age - smooth & firm w/o nodules. Musculoskeletal: Full ROM all peripheral extremities, joint stability, 5/5 strength, and normal gait. Skin: Warm and dry without rashes, lesions, cyanosis, clubbing or  ecchymosis.  Neuro: Cranial nerves intact,  reflexes equal bilaterally. Normal muscle tone, no cerebellar symptoms. Sensation intact.  Pysch: Alert and oriented X 3 with normal affect, insight and judgment appropriate.   Assessment and Plan  1. Annual Preventative/Screening Exam   - Microalbumin / creatinine urine ratio - EKG 12-Lead - POC Hemoccult Bld/Stl  - Urinalysis, Routine w reflex microscopic  - Vitamin B12 - Iron and TIBC - Testosterone - CBC with Differential/Platelet - BASIC METABOLIC PANEL WITH GFR - Hepatic function panel - Magnesium - Lipid panel - TSH - Hemoglobin A1c - Insulin, random - VITAMIN D 25 Hydroxy   2. Elevated BP  - Microalbumin / creatinine urine ratio - EKG 12-Lead - TSH  3. Mixed hyperlipidemia  - Lipid panel - TSH  4. Vitamin D deficiency  - VITAMIN D 25 Hydroxy   5. Testosterone deficiency  - Testosterone  6. Hypothyroidism   7. Screening for rectal cancer  - POC Hemoccult Bld/Stl  8. Other fatigue  - Vitamin B12 - Iron and TIBC - Testosterone - TSH  9. Other abnormal glucose  - Hemoglobin A1c - Insulin, random  10. Medication management  - Urinalysis, Routine w reflex microscopic  - CBC with Differential/Platelet - BASIC METABOLIC PANEL WITH GFR - Hepatic function panel - Magnesium   Continue prudent diet as discussed, weight control, BP monitoring, regular exercise, and medications as discussed.  Discussed med effects and SE's. Routine screening labs and tests as requested with regular follow-up as recommended. Over 40 minutes of exam, counseling, chart review and high complex critical decision making was performed

## 2015-12-11 NOTE — Patient Instructions (Addendum)
Recommend Adult Low Dose Aspirin or   coated  Aspirin 81 mg daily   To reduce risk of Colon Cancer 20 %,   Skin Cancer 26 % ,   Melanoma 46%   and   Pancreatic cancer 60%   ++++++++++++++++++++++++++++++++++++++++++++++++++++++  Vitamin D goal   is between 70-100.   Please make sure that you are taking your Vitamin D as directed.   It is very important as a natural anti-inflammatory   helping hair, skin, and nails, as well as reducing stroke and heart attack risk.   It helps your bones and helps with mood.  It also decreases numerous cancer risks so please take it as directed.   Low Vit D is associated with a 200-300% higher risk for CANCER   and 200-300% higher risk for HEART   ATTACK  &  STROKE.   ......................................  It is also associated with higher death rate at younger ages,   autoimmune diseases like Rheumatoid arthritis, Lupus, Multiple Sclerosis.     Also many other serious conditions, like depression, Alzheimer's  Dementia, infertility, muscle aches, fatigue, fibromyalgia - just to name a few.  ++++++++++++++++++++++++++++++++++++++++++++++++  Recommend the book "The END of DIETING" by Dr Joel Fuhrman   & the book "The END of DIABETES " by Dr Joel Fuhrman  At [employer] - get book & Audio CD's     Being diabetic has a  300% increased risk for heart attack, stroke, cancer, and alzheimer- type vascular dementia. It is very important that you work harder with diet by avoiding all foods that are white. Avoid white rice (brown & wild rice is OK), white potatoes (sweetpotatoes in moderation is OK), White bread or wheat bread or anything made out of white flour like bagels, donuts, rolls, buns, biscuits, cakes, pastries, cookies, pizza crust, and pasta (made from white flour & egg whites) - vegetarian pasta or spinach or wheat pasta is OK. Multigrain breads like Arnold's or [employer], or multigrain sandwich thins or flatbreads.  Diet,  exercise and weight loss can reverse and cure diabetes in the early stages.  Diet, exercise and weight loss is very important in the control and prevention of complications of diabetes which affects every system in your body, ie. Brain - dementia/stroke, eyes - glaucoma/blindness, heart - heart attack/heart failure, kidneys - dialysis, stomach - gastric paralysis, intestines - malabsorption, nerves - severe painful neuritis, circulation - gangrene & loss of a leg(s), and finally cancer and Alzheimers.    I recommend avoid fried & greasy foods,  sweets/candy, white rice (brown or wild rice or Quinoa is OK), white potatoes (sweet potatoes are OK) - anything made from white flour - bagels, doughnuts, rolls, buns, biscuits,white and wheat breads, pizza crust and traditional pasta made of white flour & egg white(vegetarian pasta or spinach or wheat pasta is OK).  Multi-grain bread is OK - like multi-grain flat bread or sandwich thins. Avoid alcohol in excess. Exercise is also important.    Eat all the vegetables you want - avoid meat, especially red meat and dairy - especially cheese.  Cheese is the most concentrated form of trans-fats which is the worst thing to clog up our arteries. Veggie cheese is OK which can be found in the fresh produce section at [employer] or [employer] or [employer]  ++++++++++++++++++++++++++++++++++++++++++++++++++ DASH Eating Plan  DASH stands for "Dietary Approaches to Stop Hypertension."   The DASH eating plan is a healthy eating plan that has been shown to reduce high   blood pressure (hypertension). Additional health benefits may include reducing the risk of type 2 diabetes mellitus, heart disease, and stroke. The DASH eating plan may also help with weight loss.  WHAT DO I NEED TO KNOW ABOUT THE DASH EATING PLAN? For the DASH eating plan, you will follow these general guidelines:  Choose foods with a percent daily value for sodium of less than 5% (as listed on the food  label).  Use salt-free seasonings or herbs instead of table salt or sea salt.  Check with your health care provider or pharmacist before using salt substitutes.  Eat lower-sodium products, often labeled as "lower sodium" or "no salt added."  Eat fresh foods.  Eat more vegetables, fruits, and low-fat dairy products.    Choose whole grains. Look for the word "whole" as the first word in the ingredient list.  Choose fish   Limit sweets, desserts, sugars, and sugary drinks.  Choose heart-healthy fats.  Eat veggie cheese   Eat more home-cooked food and less restaurant, buffet, and fast food.  Limit fried foods.  Cook foods using methods other than frying.  Limit canned vegetables. If you do use them, rinse them well to decrease the sodium.  When eating at a restaurant, ask that your food be prepared with less salt, or no salt if possible.                      WHAT FOODS CAN I EAT?  Seek help from a dietitian for individual calorie needs.  Grains Whole grain or whole wheat bread. Brown rice. Whole grain or whole wheat pasta. Quinoa, bulgur, and whole grain cereals. Low-sodium cereals. Corn or whole wheat flour tortillas. Whole grain cornbread. Whole grain crackers. Low-sodium crackers.  Vegetables Fresh or frozen vegetables (raw, steamed, roasted, or grilled). Low-sodium or reduced-sodium tomato and vegetable juices. Low-sodium or reduced-sodium tomato sauce and paste. Low-sodium or reduced-sodium canned vegetables.   Fruits All fresh, canned (in natural juice), or frozen fruits.  Protein Products  All fish and seafood.  Dried beans, peas, or lentils. Unsalted nuts and seeds. Unsalted canned beans.  Dairy Low-fat dairy products, such as skim or 1% milk, 2% or reduced-fat cheeses, low-fat ricotta or cottage cheese, or plain low-fat yogurt. Low-sodium or reduced-sodium cheeses.  Fats and Oils Tub margarines without trans fats. Light or reduced-fat mayonnaise and salad  dressings (reduced sodium). Avocado. Safflower, olive, or canola oils. Natural peanut or almond butter.  Other Unsalted popcorn and pretzels. The items listed above may not be a complete list of recommended foods or beverages. Contact your dietitian for more options.  +++++++++++++++++++++++++++++++++++++++++++  WHAT FOODS ARE NOT RECOMMENDED?  Grains/ White flour or wheat flour  White bread. White pasta. White rice. Refined cornbread. Bagels and croissants. Crackers that contain trans fat.  Vegetables  Creamed or fried vegetables. Vegetables in a . Regular canned vegetables. Regular canned tomato sauce and paste. Regular tomato and vegetable juices.  Fruits Dried fruits. Canned fruit in light or heavy syrup. Fruit juice.  Meat and Other Protein Products Meat in general. Fatty cuts of meat. Ribs, chicken wings, bacon, sausage, bologna, salami, chitterlings, fatback, hot dogs, bratwurst, and packaged luncheon meats.  Dairy Whole or 2% milk, cream, half-and-half, and cream cheese. Whole-fat or sweetened yogurt. Full-fat cheeses or blue cheese. Nondairy creamers and whipped toppings. Processed cheese, cheese spreads, or cheese curds.  Condiments Onion and garlic salt, seasoned salt, table salt, and sea salt. Canned and packaged gravies. Worcestershire sauce. Tartar sauce.  Barbecue sauce. Teriyaki sauce. Soy sauce, including reduced sodium. Steak sauce. Fish sauce. Oyster sauce. Cocktail sauce. Horseradish. Ketchup and mustard. Meat flavorings and tenderizers. Bouillon cubes. Hot sauce. Tabasco sauce. Marinades. Taco seasonings. Relishes.  Fats and Oils Butter, stick margarine, lard, shortening, ghee, and bacon fat. Coconut, palm kernel, or palm oils. Regular salad dressings.  Pickles and olives. Salted popcorn and pretzels. The items listed above may not be a complete list of foods and beverages to avoid.   Preventive Care for Adults  A healthy lifestyle and preventive care can  promote health and wellness. Preventive health guidelines for men include the following key practices:  A routine yearly physical is a good way to check with your health care provider about your health and preventative screening. It is a chance to share any concerns and updates on your health and to receive a thorough exam.  Visit your dentist for a routine exam and preventative care every 6 months. Brush your teeth twice a day and floss once a day. Good oral hygiene prevents tooth decay and gum disease.  The frequency of eye exams is based on your age, health, family medical history, use of contact lenses, and other factors. Follow your health care provider's recommendations for frequency of eye exams.  Eat a healthy diet. Foods such as vegetables, fruits, whole grains, low-fat dairy products, and lean protein foods contain the nutrients you need without too many calories. Decrease your intake of foods high in solid fats, added sugars, and salt. Eat the right amount of calories for you.Get information about a proper diet from your health care provider, if necessary.  Regular physical exercise is one of the most important things you can do for your health. Most adults should get at least 150 minutes of moderate-intensity exercise (any activity that increases your heart rate and causes you to sweat) each week. In addition, most adults need muscle-strengthening exercises on 2 or more days a week.  Maintain a healthy weight. The body mass index (BMI) is a screening tool to identify possible weight problems. It provides an estimate of body fat based on height and weight. Your health care provider can find your BMI and can help you achieve or maintain a healthy weight.For adults 20 years and older:  A BMI below 18.5 is considered underweight.  A BMI of 18.5 to 24.9 is normal.  A BMI of 25 to 29.9 is considered overweight.  A BMI of 30 and above is considered obese.  Maintain normal blood lipids  and cholesterol levels by exercising and minimizing your intake of saturated fat. Eat a balanced diet with plenty of fruit and vegetables. Blood tests for lipids and cholesterol should begin at age 68 and be repeated every 5 years. If your lipid or cholesterol levels are high, you are over 50, or you are at high risk for heart disease, you may need your cholesterol levels checked more frequently.Ongoing high lipid and cholesterol levels should be treated with medicines if diet and exercise are not working.  If you smoke, find out from your health care provider how to quit. If you do not use tobacco, do not start.  Lung cancer screening is recommended for adults aged 77-80 years who are at high risk for developing lung cancer because of a history of smoking. A yearly low-dose CT scan of the lungs is recommended for people who have at least a 30-pack-year history of smoking and are a current smoker or have quit within the past 15  years. A pack year of smoking is smoking an average of 1 pack of cigarettes a day for 1 year (for example: 1 pack a day for 30 years or 2 packs a day for 15 years). Yearly screening should continue until the smoker has stopped smoking for at least 15 years. Yearly screening should be stopped for people who develop a health problem that would prevent them from having lung cancer treatment.  If you choose to drink alcohol, do not have more than 2 drinks per day. One drink is considered to be 12 ounces (355 mL) of beer, 5 ounces (148 mL) of wine, or 1.5 ounces (44 mL) of liquor.  High blood pressure causes heart disease and increases the risk of stroke. Your blood pressure should be checked. Ongoing high blood pressure should be treated with medicines, if weight loss and exercise are not effective.  If you are 36-36 years old, ask your health care provider if you should take aspirin to prevent heart disease.  Diabetes screening involves taking a blood sample to check your fasting  blood sugar level. Testing should be considered at a younger age or be carried out more frequently if you are overweight and have at least 1 risk factor for diabetes.  Colorectal cancer can be detected and often prevented. Most routine colorectal cancer screening begins at the age of 12 and continues through age 61. However, your health care provider may recommend screening at an earlier age if you have risk factors for colon cancer. On a yearly basis, your health care provider may provide home test kits to check for hidden blood in the stool. Use of a small camera at the end of a tube to directly examine the colon (sigmoidoscopy or colonoscopy) can detect the earliest forms of colorectal cancer. Talk to your health care provider about this at age 58, when routine screening begins. Direct exam of the colon should be repeated every 5-10 years through age 15, unless early forms of precancerous polyps or small growths are found.  Screening for abdominal aortic aneurysm (AAA)  are recommended for persons over age 13 who have history of hypertensionor who are current or former smokers.  Talk with your health care provider about prostate cancer screening.  Testicular cancer screening is recommended for adult males. Screening includes self-exam, a health care provider exam, and other screening tests. Consult with your health care provider about any symptoms you have or any concerns you have about testicular cancer.  Use sunscreen. Apply sunscreen liberally and repeatedly throughout the day. You should seek shade when your shadow is shorter than you. Protect yourself by wearing long sleeves, pants, a wide-brimmed hat, and sunglasses year round, whenever you are outdoors.  Once a month, do a whole-body skin exam, using a mirror to look at the skin on your back. Tell your health care provider about new moles, moles that have irregular borders, moles that are larger than a pencil eraser, or moles that have changed  in shape or color.  Stay current with required vaccines (immunizations).  Influenza vaccine. All adults should be immunized every year.  Tetanus, diphtheria, and acellular pertussis (Td, Tdap) vaccine. An adult who has not previously received Tdap or who does not know his vaccine status should receive 1 dose of Tdap. This initial dose should be followed by tetanus and diphtheria toxoids (Td) booster doses every 10 years. Adults with an unknown or incomplete history of completing a 3-dose immunization series with Td-containing vaccines should begin or complete  a primary immunization series including a Tdap dose. Adults should receive a Td booster every 10 years.  Zoster vaccine. One dose is recommended for adults aged 60 years or older unless certain conditions are present.    Pneumococcal 13-valent conjugate (PCV13) vaccine. When indicated, a person who is uncertain of his immunization history and has no record of immunization should receive the PCV13 vaccine. An adult aged 19 years or older who has certain medical conditions and has not been previously immunized should receive 1 dose of PCV13 vaccine. This PCV13 should be followed with a dose of pneumococcal polysaccharide (PPSV23) vaccine. The PPSV23 vaccine dose should be obtained at least 8 weeks after the dose of PCV13 vaccine. An adult aged 19 years or older who has certain medical conditions and previously received 1 or more doses of PPSV23 vaccine should receive 1 dose of PCV13. The PCV13 vaccine dose should be obtained 1 or more years after the last PPSV23 vaccine dose.    Pneumococcal polysaccharide (PPSV23) vaccine. When PCV13 is also indicated, PCV13 should be obtained first. All adults aged 65 years and older should be immunized. An adult younger than age 65 years who has certain medical conditions should be immunized. Any person who resides in a nursing home or long-term care facility should be immunized. An adult smoker should be  immunized. People with an immunocompromised condition and certain other conditions should receive both PCV13 and PPSV23 vaccines. People with human immunodeficiency virus (HIV) infection should be immunized as soon as possible after diagnosis. Immunization during chemotherapy or radiation therapy should be avoided. Routine use of PPSV23 vaccine is not recommended for American Indians, [place] Natives, or people younger than 65 years unless there are medical conditions that require PPSV23 vaccine. When indicated, people who have unknown immunization and have no record of immunization should receive PPSV23 vaccine. One-time revaccination 5 years after the first dose of PPSV23 is recommended for people aged 19-64 years who have chronic kidney failure, nephrotic syndrome, asplenia, or immunocompromised conditions. People who received 1-2 doses of PPSV23 before age 65 years should receive another dose of PPSV23 vaccine at age 65 years or later if at least 5 years have passed since the previous dose. Doses of PPSV23 are not needed for people immunized with PPSV23 at or after age 65 years.  Hepatitis A vaccine. Adults who wish to be protected from this disease, have certain high-risk conditions, work with hepatitis A-infected animals, work in hepatitis A research labs, or travel to or work in countries with a high rate of hepatitis A should be immunized. Adults who were previously unvaccinated and who anticipate close contact with an international adoptee during the first 60 days after arrival in the [place] from a country with a high rate of hepatitis A should be immunized.  Hepatitis B vaccine. Adults should be immunized if they wish to be protected from this disease, have certain high-risk conditions, may be exposed to blood or other infectious body fluids, are household contacts or sex partners of hepatitis B positive people, are clients or workers in certain care facilities, or travel to or work in countries  with a high rate of hepatitis B.  Preventive Service / Frequency  Ages 19 to 39  Blood pressure check.  Lipid and cholesterol check.  Hepatitis C blood test.** / For any individual with known risks for hepatitis C.  Skin self-exam. / Monthly.  Influenza vaccine. / Every year.  Tetanus, diphtheria, and acellular pertussis (Tdap, Td) vaccine.** / Consult   your health care provider. 1 dose of Td every 10 years.  HPV vaccine. / 3 doses over 6 months, if 68 or younger.  Measles, mumps, rubella (MMR) vaccine.** / You need at least 1 dose of MMR if you were born in 1957 or later. You may also need a second dose.  Pneumococcal 13-valent conjugate (PCV13) vaccine.** / Consult your health care provider.  Pneumococcal polysaccharide (PPSV23) vaccine.** / 1 to 2 doses if you smoke cigarettes or if you have certain conditions.  Meningococcal vaccine.** / 1 dose if you are age 35 to 14 years and a Market researcher living in a residence hall, or have one of several medical conditions. You may also need additional booster doses.  Hepatitis A vaccine.** / Consult your health care provider.  Hepatitis B vaccine.** / Consult your health care provider.  9 Ways to Naturally Increase Testosterone Levels  1.   Lose Weight  If you're overweight, shedding the excess pounds may increase your testosterone levels, according to research presented at the Endocrine Society's 2012 meeting. Overweight men are more likely to have low testosterone levels to begin with, so this is an important trick to increase your body's testosterone production when you need it most.  2.   High-Intensity Exercise like [employer]   Short intense exercise has a proven positive effect on increasing testosterone levels and preventing its decline. That's unlike aerobics or prolonged moderate exercise, which have shown to have negative or no effect on testosterone levels. Having a whey protein meal after exercise can  further enhance the satiety/testosterone-boosting impact (hunger hormones cause the opposite effect on your testosterone and libido). Here's a summary of what a typical high-intensity [employer] routine might look like: " Warm up for three minutes  " Exercise as hard and fast as you can for 30 seconds. You should feel like you couldn't possibly go on another few seconds  " Recover at a slow to moderate pace for 90 seconds  " Repeat the high intensity exercise and recovery 7 more times .  3.   Consume Plenty of Zinc  The mineral zinc is important for testosterone production, and supplementing your diet for as little as six weeks has been shown to cause a marked improvement in testosterone among men with low levels.1 Likewise, research has shown that restricting dietary sources of zinc leads to a significant decrease in testosterone, while zinc supplementation increases it2 -- and even protects men from exercised-induced reductions in testosterone levels.3 It's estimated that up to 56 percent of adults over the age of 60 may have lower than recommended zinc intakes; even when dietary supplements were added in, an estimated 20-25 percent of older adults still had inadequate zinc intakes, according to a [employer] and Nutrition Examination Survey.4 Your diet is the best source of zinc; along with protein-rich foods like meats and fish, other good dietary sources of zinc include raw milk, raw cheese, beans, and yogurt or kefir made from raw milk. It can be difficult to obtain enough dietary zinc if you're a vegetarian, and also for meat-eaters as well, largely because of conventional farming methods that rely heavily on chemical fertilizers and pesticides. These chemicals deplete the soil of nutrients ... nutrients like zinc that must be absorbed by plants in order to be passed on to you. In many cases, you may further deplete the nutrients in your food by the way you prepare it. For most food, cooking  it will drastically reduce its levels of  nutrients like zinc . particularly over-cooking, which many people do. If you decide to use a zinc supplement, stick to a dosage of less than 40 mg a day, as this is the recommended adult upper limit. Taking too much zinc can interfere with your body's ability to absorb other minerals, especially copper, and may cause nausea as a side effect.  4.   Strength Training  In addition to [employer], strength training is also known to boost testosterone levels, provided you are doing so intensely enough. When strength training to boost testosterone, you'll want to increase the weight and lower your number of reps, and then focus on exercises that work a large number of muscles, such as dead lifts or squats.  You can "turbo-charge" your weight training by going slower. By slowing down your movement, you're actually turning it into a high-intensity exercise. Super Slow movement allows your muscle, at the microscopic level, to access the maximum number of cross-bridges between the protein filaments that produce movement in the muscle.   5.   Optimize Your Vitamin D Levels  Vitamin D, a steroid hormone, is essential for the healthy development of the nucleus of the sperm cell, and helps maintain semen quality and sperm count. Vitamin D also increases levels of testosterone, which may boost libido. In one study, overweight men who were given vitamin D supplements had a significant increase in testosterone levels after one year.5   6.   Reduce Stress  When you're under a lot of stress, your body releases high levels of the stress hormone cortisol. This hormone actually blocks the effects of testosterone,6 presumably because, from a biological standpoint, testosterone-associated behaviors (mating, competing, aggression) may have lowered your chances of survival in an emergency (hence, the "fight or flight" response is dominant, courtesy of cortisol).  7.   Limit or  Eliminate Sugar from Your Diet  Testosterone levels decrease after you eat sugar, which is likely because the sugar leads to a high insulin level, another factor leading to low testosterone.7 Based on USDA estimates, the average American consumes 12 teaspoons of sugar a day, which equates to about TWO TONS of sugar during a lifetime.  8.   Eat Healthy Fats  By healthy, this means not only mono- and polyunsaturated fats, like that found in avocadoes and nuts, but also saturated, as these are essential for building testosterone. Research shows that a diet with less than 40 percent of energy as fat (and that mainly from animal sources, i.e. saturated) lead to a decrease in testosterone levels. ie eat less animal products - as Meat , poultry and dairy. Experts agree that the ideal diet includes somewhere between 50-70 percent fat.  It's important to understand that your body requires saturated fats from animal and vegetable sources (such as meat, dairy, certain oils, and tropical plants like coconut) for optimal functioning, and if you neglect this important food group in favor of sugar, grains and other starchy carbs, your health and weight are almost guaranteed to suffer. Examples of healthy fats you can eat more of to give your testosterone levels a boost include: Olives and Olive oil  Coconuts and coconut oil Butter made from raw grass-fed organic milk Raw nuts, such as, almonds or pecans Organic pastured egg yolks Avocados Grass-fed meats Palm oil Unheated organic nut oils   9.   Boost Your Intake of Branch Chain Amino Acids (BCAA) from Foods Like [place] suggests that BCAAs result in higher testosterone levels, particularly when taken  along with resistance training. While BCAAs are available in supplement form, you'll find the highest concentrations of BCAAs like leucine in whey protein. Even when getting leucine from your natural food supply, it's often wasted or used as a building  block instead of an anabolic agent. So to create the correct anabolic environment, you need to boost leucine consumption way beyond mere maintenance levels. That said, keep in mind that using leucine as a free form amino acid can be highly counterproductive as when free form amino acids are artificially administrated, they rapidly enter your circulation while disrupting insulin function, and impairing your body's glycemic control. Food-based leucine is really the ideal form that can benefit your muscles without side effects.

## 2015-12-12 LAB — VITAMIN D 25 HYDROXY (VIT D DEFICIENCY, FRACTURES): VIT D 25 HYDROXY: 40 ng/mL (ref 30–100)

## 2015-12-12 LAB — LIPID PANEL
CHOL/HDL RATIO: 3.8 ratio (ref <=5.0)
Cholesterol: 149 mg/dL (ref 125–200)
HDL: 39 mg/dL — ABNORMAL LOW (ref >=40)
LDL CALC: 64 mg/dL (ref <130)
Triglycerides: 230 mg/dL — ABNORMAL HIGH (ref <150)
VLDL: 46 mg/dL — ABNORMAL HIGH (ref <30)

## 2015-12-12 LAB — BASIC METABOLIC PANEL WITH GFR
BUN: 16 mg/dL (ref 7–25)
CHLORIDE: 103 mmol/L (ref 98–110)
CO2: 29 mmol/L (ref 20–31)
CREATININE: 1.03 mg/dL (ref 0.60–1.35)
Calcium: 9.6 mg/dL (ref 8.6–10.3)
GFR, Est African American: >89 mL/min (ref >=60)
GFR, Est Non African American: >89 mL/min (ref >=60)
Glucose, Bld: 81 mg/dL (ref 65–99)
Potassium: 4.2 mmol/L (ref 3.5–5.3)
Sodium: 141 mmol/L (ref 135–146)

## 2015-12-12 LAB — HEMOGLOBIN A1C
HEMOGLOBIN A1C: 5.1 % (ref <5.7)
Mean Plasma Glucose: 100 mg/dL (ref <117)

## 2015-12-12 LAB — TESTOSTERONE: Testosterone: 160 ng/dL — ABNORMAL LOW (ref 250–827)

## 2015-12-12 LAB — HEPATIC FUNCTION PANEL
ALBUMIN: 4.2 g/dL (ref 3.6–5.1)
ALK PHOS: 68 U/L (ref 40–115)
ALT: 51 U/L — AB (ref 9–46)
AST: 37 U/L (ref 10–40)
Bilirubin, Direct: 0.1 mg/dL (ref <=0.2)
Indirect Bilirubin: 0.3 mg/dL (ref 0.2–1.2)
Total Bilirubin: 0.4 mg/dL (ref 0.2–1.2)
Total Protein: 6.8 g/dL (ref 6.1–8.1)

## 2015-12-12 LAB — URINALYSIS, ROUTINE W REFLEX MICROSCOPIC
Bilirubin Urine: NEGATIVE
GLUCOSE, UA: NEGATIVE
HGB URINE DIPSTICK: NEGATIVE
KETONES UR: NEGATIVE
Leukocytes, UA: NEGATIVE
Nitrite: NEGATIVE
PH: 6.5 (ref 5.0–8.0)
Protein, ur: NEGATIVE
SPECIFIC GRAVITY, URINE: 1.015 (ref 1.001–1.035)

## 2015-12-12 LAB — IRON AND TIBC
%SAT: 35 % (ref 15–60)
IRON: 113 ug/dL (ref 50–180)
TIBC: 327 ug/dL (ref 250–425)
UIBC: 214 ug/dL (ref 125–400)

## 2015-12-12 LAB — INSULIN, RANDOM: INSULIN: 25.7 u[IU]/mL — AB (ref 2.0–19.6)

## 2015-12-12 LAB — MICROALBUMIN / CREATININE URINE RATIO
CREATININE, URINE: 89 mg/dL (ref 20–370)
MICROALB UR: 0.5 mg/dL
Microalb Creat Ratio: 6 mcg/mg creat (ref <30)

## 2015-12-12 LAB — VITAMIN B12: VITAMIN B 12: 858 pg/mL (ref 211–911)

## 2015-12-12 LAB — TSH: TSH: 4.376 u[IU]/mL (ref 0.350–4.500)

## 2015-12-12 LAB — MAGNESIUM: MAGNESIUM: 2 mg/dL (ref 1.5–2.5)

## 2015-12-12 NOTE — Addendum Note (Signed)
Addended by: Valrie Hart C on: 12/12/2015 08:07 AM   Modules accepted: Orders

## 2015-12-14 LAB — TB SKIN TEST
INDURATION: 0 mm
TB Skin Test: NEGATIVE

## 2015-12-19 ENCOUNTER — Other Ambulatory Visit: Payer: Self-pay | Admitting: Internal Medicine

## 2015-12-27 ENCOUNTER — Other Ambulatory Visit: Payer: Self-pay | Admitting: Internal Medicine

## 2016-05-01 ENCOUNTER — Ambulatory Visit (INDEPENDENT_AMBULATORY_CARE_PROVIDER_SITE_OTHER): Payer: BLUE CROSS/BLUE SHIELD | Admitting: Physician Assistant

## 2016-05-01 ENCOUNTER — Encounter: Payer: Self-pay | Admitting: Physician Assistant

## 2016-05-01 VITALS — BP 122/64 | HR 79 | Temp 97.7°F | Resp 16 | Ht 71.0 in | Wt 184.0 lb

## 2016-05-01 DIAGNOSIS — H1013 Acute atopic conjunctivitis, bilateral: Secondary | ICD-10-CM | POA: Diagnosis not present

## 2016-05-01 MED ORDER — NEOMYCIN-POLYMYXIN-DEXAMETH 0.1 % OP SUSP: 1.0000 [drp] | Freq: Four times a day (QID) | OPHTHALMIC | Status: AC

## 2016-05-01 NOTE — Progress Notes (Signed)
   Subjective:    Patient ID: Ryan Lam, male    DOB: 01/11/1976, 40 y.o.   MRN: [id number]  HPI The patient has history of matting and discharge from both eyes for 3 days.  Patient woke up with left eye pain with redness, swelling, and HA, right eye with some discomfort, he has some mild photophobia.  Patient denies blurred vision, discharge, itching and visual field deficit. He does not wear contacts. Has 5516 month old daughter but no sick contacts, no recent chemical contact. Was working out in the yard Sunday with rash on right arm that resolved. Denies sinus symptoms, fever, chills. Has been using steroid nasal spray.   Blood pressure 122/64, pulse 79, temperature 97.7 F (36.5 C), temperature source Temporal, resp. rate 16, height 5\' 11"  (1.803 m), weight 184 lb (83.462 kg), SpO2 97 %.  Current Outpatient Prescriptions on File Prior to Visit  Medication Sig Dispense Refill  . Cholecalciferol (VITAMIN D) 2000 UNITS tablet Take 2,000 Units by mouth daily.    Marland Kitchen. HYDROcodone-acetaminophen (NORCO) 5-325 MG per tablet Take 1 tablet by mouth every 6 (six) hours as needed for moderate pain. 60 tablet 0  . levothyroxine (SYNTHROID, LEVOTHROID) 50 MCG tablet Take 1 tablet daily on an empty stomach for 30 minutes 90 tablet 99  . Magnesium 500 MG CAPS Take 500 mg by mouth daily.    . Omega-3 Fatty Acids (FISH OIL) 1200 MG CAPS Takes daily    . Zinc 25 MG TABS Take 25 mg by mouth daily.     No current facility-administered medications on file prior to visit.    Past Medical History  Diagnosis Date  . Thyroid disease   . Hypogonadism male   . Kidney stone     Review of Systems  Constitutional: Negative.   Eyes: Positive for photophobia, pain and redness. Negative for discharge, itching and visual disturbance.  Respiratory: Negative.   Cardiovascular: Negative.   Gastrointestinal: Negative.   Genitourinary: Negative.   Musculoskeletal: Negative.   Neurological: Positive for headaches.  Negative for dizziness, tremors, seizures, syncope, facial asymmetry, speech difficulty, weakness, light-headedness and numbness.       Objective:   Physical Exam  Constitutional: He appears well-developed and well-nourished. No distress.  HENT:  Head: Normocephalic and atraumatic.  Right Ear: External ear normal.  Left Ear: External ear normal.  Nose: Nose normal.  Mouth/Throat: Oropharynx is clear and moist. No oropharyngeal exudate.  Eyes: EOM and lids are normal. Pupils are equal, round, and reactive to light. Right eye exhibits no discharge and no exudate. No foreign body present in the right eye. Left eye exhibits no discharge and no exudate. No foreign body present in the left eye. Right conjunctiva is injected. Right conjunctiva has no hemorrhage. Left conjunctiva is injected. Left conjunctiva has no hemorrhage.  Soft, nontender globe  Neck: Normal range of motion. Neck supple.  Cardiovascular: Normal rate and regular rhythm.   Pulmonary/Chest: Effort normal and breath sounds normal.  Lymphadenopathy:    He has no cervical adenopathy.      Assessment & Plan:  Bilateral conjunctivitis  Some photophobia and HA, no hard globe, no visual field defects Will call eye doctor and make an appointment, stop nasal spray, stay on allergy pill, maxitrol If worsening vision go to ER

## 2016-05-01 NOTE — Patient Instructions (Signed)
Call your eye doctor ASAP If you have any acute vision changes go to the ER  Allergic Conjunctivitis Allergic conjunctivitis is inflammation of the clear membrane that covers the white part of your eye and the inner surface of your eyelid (conjunctiva), and it is caused by allergies. The blood vessels in the conjunctiva become inflamed, and this causes the eye to become red or pink, and it often causes itchiness in the eye. Allergic conjunctivitis cannot be spread by one person to another person (noncontagious). CAUSES This condition is caused by an allergic reaction. Common causes of an allergic reaction (allergens) include:  Dust.  Pollen.  Mold.  Animal dander or secretions. RISK FACTORS This condition is more likely to develop if you are exposed to high levels of allergens that cause the allergic reaction. This might include being outdoors when air pollen levels are high or being around animals that you are allergic to. SYMPTOMS Symptoms of this condition may include:  Eye redness.  Tearing of the eyes.  Watery eyes.  Itchy eyes.  Burning feeling in the eyes.  Clear drainage from the eyes.  Swollen eyelids. DIAGNOSIS This condition may be diagnosed by medical history and physical exam. If you have drainage from your eyes, it may be tested to rule out other causes of conjunctivitis. TREATMENT Treatment for this condition often includes medicines. These may be eye drops, ointments, or oral medicines. They may be prescription medicines or over-the-counter medicines. HOME CARE INSTRUCTIONS  Take or apply medicines only as directed by your health care provider.  Do not touch or rub your eyes.  Do not wear contact lenses until the inflammation is gone. Wear glasses instead.  Do not wear eye makeup until the inflammation is gone.  Apply a cool, clean washcloth to your eye for 10-20 minutes, 3-4 times a day.  Try to avoid whatever allergen is causing the allergic  reaction. SEEK MEDICAL CARE IF:  Your symptoms get worse.  You have pus draining from your eye.  You have new symptoms.  You have a fever.   This information is not intended to replace advice given to you by your health care provider. Make sure you discuss any questions you have with your health care provider.   Document Released: 01/25/2003 Document Revised: 11/25/2014 Document Reviewed: 08/16/2014 Elsevier Interactive Patient Education 2016 [employer]  Glaucoma Glaucoma is a condition that is caused by high pressure inside the eye. The pressure in the eye is raised because fluid (aqueous humor) within the eye cannot get out through the normal drainage system. The increased pressure can cause damage to the nerves of the eye, and that can result in loss of vision. It is important to diagnose glaucoma early before damage occurs. Early treatment can often prevent vision loss. There are two main types of glaucoma:  Open-angle glaucoma. This is a chronic condition that causes the pressure inside the eye to rise slowly. This is the most common type of glaucoma, and it often causes no symptoms until later in the disease.  Acute angle-closure glaucoma. With this type, the pressure inside the eye rises suddenly to a very high level. This causes severe pain and requires treatment right away. CAUSES In many cases, the cause of this condition is not known. Glaucoma can sometimes result from other diseases, such as infection, cataracts, or tumors.  RISK FACTORS This condition is more likely to develop in people who:  Are older than 40 years of age.  Are of African-American descent.  Have high blood pressure or diabetes.  Have a family history of glaucoma.  Have a previous eye injury or eye surgery.  Are farsighted.  Use certain medicines that increase pressure in the eyes or cause the pupils to widen (dilate). SYMPTOMS In many cases, open-angle glaucoma causes no symptoms early on.  If it is not treated, the condition will get worse and may cause a loss of side vision (peripheral vision). This can advance to tunnel vision, which means that you are able to see straight ahead but you have a loss of peripheral vision in all directions. Eventually, total loss of vision can occur. Symptoms of acute angle-closure glaucoma develop suddenly and may include:  Clouded vision.  Severe pain in your affected eye.  Severe headache in the area around your eye.  Feeling nauseous.  Vomiting. DIAGNOSIS Glaucoma is diagnosed through an eye exam by an eye specialist (ophthalmologist). This specialist will perform a test to measure the pressure in your eye (tonometry). Eye tests to check your vision-- especially your peripheral vision--will be done. The ophthalmologist will also use various instruments to look inside your eye and check for damage to the nerves. Because glaucoma often does not cause symptoms until late in the disease, it is often found during a routine eye exam. It is important to have your eyes checked regularly. TREATMENT Early treatment is important to prevent vision loss. Treatment will focus on lowering the pressure in your eyes. This usually involves using medicines in the form of eye drops. The type of medicine will depend on how bad the disease is and the degree of damage. Laser treatments or other types of surgery may be needed in some cases.  Acute angle-closure glaucoma requires emergency treatment to help prevent vision loss. This treatment may involve eye drops and medicines that are given in pill form or through an IV tube. Surgery is usually done on both eyes right away. HOME CARE INSTRUCTIONS  Take medicines only as directed by your health care provider. Use your eye drops exactly as instructed.It is likely that you will need to use this medicine for the rest of your life.  Get regular exercise, but talk with your health care provider about which types of  exercise are safe for you. You may need to avoid yoga or other types of exercise that may involve standing on your head.  Keep all follow-up visits as directed by your health care provider. This is important. SEEK MEDICAL CARE IF:  You have new or worsening symptoms. SEEK IMMEDIATE MEDICAL CARE IF:  You have severe pain in your affected eye.  You have problems with your vision.  You have a bad headache in the area around your eye.  You develop nausea and vomiting.  You have the same or similar symptoms in your other eye.   This information is not intended to replace advice given to you by your health care provider. Make sure you discuss any questions you have with your health care provider.   Document Released: 11/04/2005 Document Revised: 11/25/2014 Document Reviewed: 08/16/2014 Elsevier Interactive Patient Education [employer]

## 2016-06-10 ENCOUNTER — Ambulatory Visit (INDEPENDENT_AMBULATORY_CARE_PROVIDER_SITE_OTHER): Payer: BLUE CROSS/BLUE SHIELD | Admitting: Internal Medicine

## 2016-06-10 ENCOUNTER — Encounter (INDEPENDENT_AMBULATORY_CARE_PROVIDER_SITE_OTHER): Payer: Self-pay

## 2016-06-10 ENCOUNTER — Encounter: Payer: Self-pay | Admitting: Internal Medicine

## 2016-06-10 VITALS — BP 126/74 | HR 74 | Temp 98.0°F | Resp 16 | Ht 71.0 in | Wt 183.0 lb

## 2016-06-10 DIAGNOSIS — E349 Endocrine disorder, unspecified: Secondary | ICD-10-CM

## 2016-06-10 DIAGNOSIS — E559 Vitamin D deficiency, unspecified: Secondary | ICD-10-CM | POA: Diagnosis not present

## 2016-06-10 DIAGNOSIS — E039 Hypothyroidism, unspecified: Secondary | ICD-10-CM

## 2016-06-10 DIAGNOSIS — E782 Mixed hyperlipidemia: Secondary | ICD-10-CM

## 2016-06-10 DIAGNOSIS — E291 Testicular hypofunction: Secondary | ICD-10-CM | POA: Diagnosis not present

## 2016-06-10 DIAGNOSIS — Z79899 Other long term (current) drug therapy: Secondary | ICD-10-CM | POA: Diagnosis not present

## 2016-06-10 DIAGNOSIS — R030 Elevated blood-pressure reading, without diagnosis of hypertension: Secondary | ICD-10-CM | POA: Diagnosis not present

## 2016-06-10 DIAGNOSIS — IMO0001 Reserved for inherently not codable concepts without codable children: Secondary | ICD-10-CM

## 2016-06-10 LAB — HEPATIC FUNCTION PANEL
ALBUMIN: 4.2 g/dL (ref 3.6–5.1)
ALK PHOS: 77 U/L (ref 40–115)
ALT: 47 U/L — ABNORMAL HIGH (ref 9–46)
AST: 34 U/L (ref 10–40)
BILIRUBIN INDIRECT: 0.3 mg/dL (ref 0.2–1.2)
BILIRUBIN TOTAL: 0.4 mg/dL (ref 0.2–1.2)
Bilirubin, Direct: 0.1 mg/dL (ref <=0.2)
Total Protein: 6.6 g/dL (ref 6.1–8.1)

## 2016-06-10 LAB — CBC WITH DIFFERENTIAL/PLATELET
BASOS ABS: 0 {cells}/uL (ref 0–200)
Basophils Relative: 0 %
Eosinophils Absolute: 120 cells/uL (ref 15–500)
Eosinophils Relative: 2 %
HEMATOCRIT: 43.9 % (ref 38.5–50.0)
HEMOGLOBIN: 15.1 g/dL (ref 13.2–17.1)
LYMPHS ABS: 1140 {cells}/uL (ref 850–3900)
Lymphocytes Relative: 19 %
MCH: 31.3 pg (ref 27.0–33.0)
MCHC: 34.4 g/dL (ref 32.0–36.0)
MCV: 91.1 fL (ref 80.0–100.0)
MONO ABS: 780 {cells}/uL (ref 200–950)
MPV: 10.9 fL (ref 7.5–12.5)
Monocytes Relative: 13 %
NEUTROS PCT: 66 %
Neutro Abs: 3960 cells/uL (ref 1500–7800)
Platelets: 216 10*3/uL (ref 140–400)
RBC: 4.82 MIL/uL (ref 4.20–5.80)
RDW: 12.8 % (ref 11.0–15.0)
WBC: 6 10*3/uL (ref 3.8–10.8)

## 2016-06-10 LAB — BASIC METABOLIC PANEL WITH GFR
BUN: 13 mg/dL (ref 7–25)
CHLORIDE: 103 mmol/L (ref 98–110)
CO2: 27 mmol/L (ref 20–31)
Calcium: 9.4 mg/dL (ref 8.6–10.3)
Creat: 1.15 mg/dL (ref 0.60–1.35)
GFR, EST NON AFRICAN AMERICAN: 80 mL/min (ref >=60)
GFR, Est African American: >89 mL/min (ref >=60)
GLUCOSE: 94 mg/dL (ref 65–99)
POTASSIUM: 4.2 mmol/L (ref 3.5–5.3)
Sodium: 140 mmol/L (ref 135–146)

## 2016-06-10 LAB — LIPID PANEL
CHOL/HDL RATIO: 3.4 ratio (ref <=5.0)
Cholesterol: 151 mg/dL (ref 125–200)
HDL: 44 mg/dL (ref >=40)
LDL CALC: 70 mg/dL (ref <130)
Triglycerides: 187 mg/dL — ABNORMAL HIGH (ref <150)
VLDL: 37 mg/dL — ABNORMAL HIGH (ref <30)

## 2016-06-10 LAB — TESTOSTERONE: TESTOSTERONE: 159 ng/dL — AB (ref 250–827)

## 2016-06-10 LAB — TSH: TSH: 4.3 m[IU]/L (ref 0.40–4.50)

## 2016-06-10 NOTE — Progress Notes (Signed)
Assessment and Plan:  Hypertension:  -Continue medication,  -monitor blood pressure at home.  -Continue DASH diet.   -Reminder to go to the ER if any CP, SOB, nausea, dizziness, severe HA, changes vision/speech, left arm numbness and tingling, and jaw pain.  Cholesterol: -Continue diet and exercise.  -Check cholesterol.   Vitamin D Def: -continue medications.   Hypothyroidism -cont levothyroxine -TSH  Hypogonadism -considering testosterone supplementation -testosterone level   Continue diet and meds as discussed. Further disposition pending results of labs.  HPI 40 y.o. male  presents for 3 month follow up with hypertension, hyperlipidemia, prediabetes and vitamin D.   His blood pressure has been controlled at home, today their BP is  .   He does workout. He denies chest pain, shortness of breath, dizziness.   He is on cholesterol medication and denies myalgias. His cholesterol is at goal. The cholesterol last visit was:   Lab Results  Component Value Date   CHOL 149 12/11/2015   HDL 39 (L) 12/11/2015   LDLCALC 64 12/11/2015   TRIG 230 (H) 12/11/2015   CHOLHDL 3.8 12/11/2015    Patient is on Vitamin D supplement.  Lab Results  Component Value Date   VD25OH 40 12/11/2015      He does have a history of low testosterone.  He is interested in possibly trying supplementation.  He does have new baby and is not sleeping much.  He is also not exercising currently.    He is taking his thyroid medication.  He does admit to missing a couple doses.    Current Medications:  Current Outpatient Prescriptions on File Prior to Visit  Medication Sig Dispense Refill  . Cholecalciferol (VITAMIN D) 2000 UNITS tablet Take 6,000 Units by mouth daily.     Marland Kitchen HYDROcodone-acetaminophen (NORCO) 5-325 MG per tablet Take 1 tablet by mouth every 6 (six) hours as needed for moderate pain. 60 tablet 0  . levothyroxine (SYNTHROID, LEVOTHROID) 50 MCG tablet Take 1 tablet daily on an empty stomach  for 30 minutes 90 tablet 99  . Zinc 25 MG TABS Take 25 mg by mouth daily.     No current facility-administered medications on file prior to visit.     Medical History:  Past Medical History:  Diagnosis Date  . Hypogonadism male   . Kidney stone   . Thyroid disease     Allergies:  Allergies  Allergen Reactions  . Penicillins      Review of Systems:  Review of Systems  Constitutional: Negative for chills, fever and malaise/fatigue.  HENT: Negative for congestion, ear pain and sore throat.   Eyes: Negative.   Respiratory: Negative for cough, shortness of breath and wheezing.   Cardiovascular: Negative for chest pain, palpitations and leg swelling.  Gastrointestinal: Negative for abdominal pain, blood in stool, constipation, diarrhea, heartburn and melena.  Genitourinary: Negative.   Skin: Negative.   Neurological: Negative for dizziness, sensory change, loss of consciousness and headaches.  Psychiatric/Behavioral: Negative for depression. The patient is not nervous/anxious and does not have insomnia.     Family history- Review and unchanged  Social history- Review and unchanged  Physical Exam: Ht 5\' 11"  (1.803 m)   Wt 183 lb (83 kg)   BMI 25.52 kg/m  Wt Readings from Last 3 Encounters:  06/10/16 183 lb (83 kg)  05/01/16 184 lb (83.5 kg)  12/11/15 185 lb (83.9 kg)    General Appearance: Well nourished well developed, in no apparent distress. Eyes: PERRLA, EOMs, conjunctiva no  swelling or erythema ENT/Mouth: Ear canals normal without obstruction, swelling, erythma, discharge.  TMs normal bilaterally.  Oropharynx moist, clear, without exudate, or postoropharyngeal swelling. Neck: Supple, thyroid normal,no cervical adenopathy  Respiratory: Respiratory effort normal, Breath sounds clear A&P without rhonchi, wheeze, or rale.  No retractions, no accessory usage. Cardio: RRR with no MRGs. Brisk peripheral pulses without edema.  Abdomen: Soft, + BS,  Non tender, no  guarding, rebound, hernias, masses. Musculoskeletal: Full ROM, 5/5 strength, Normal gait Skin: Warm, dry without rashes, lesions, ecchymosis.  Neuro: Awake and oriented X 3, Cranial nerves intact. Normal muscle tone, no cerebellar symptoms. Psych: Normal affect, Insight and Judgment appropriate.    Terri Piedra, PA-C 2:43 PM [facility]

## 2016-09-10 ENCOUNTER — Ambulatory Visit: Payer: Self-pay | Admitting: Internal Medicine

## 2016-09-24 ENCOUNTER — Ambulatory Visit (INDEPENDENT_AMBULATORY_CARE_PROVIDER_SITE_OTHER): Payer: BLUE CROSS/BLUE SHIELD | Admitting: Internal Medicine

## 2016-09-24 ENCOUNTER — Encounter: Payer: Self-pay | Admitting: Internal Medicine

## 2016-09-24 VITALS — BP 126/80 | HR 68 | Temp 98.0°F | Resp 18 | Ht 71.0 in | Wt 186.0 lb

## 2016-09-24 DIAGNOSIS — E559 Vitamin D deficiency, unspecified: Secondary | ICD-10-CM

## 2016-09-24 DIAGNOSIS — Z23 Encounter for immunization: Secondary | ICD-10-CM

## 2016-09-24 DIAGNOSIS — E039 Hypothyroidism, unspecified: Secondary | ICD-10-CM | POA: Diagnosis not present

## 2016-09-24 DIAGNOSIS — Z79899 Other long term (current) drug therapy: Secondary | ICD-10-CM

## 2016-09-24 LAB — TSH: TSH: 6.64 m[IU]/L — AB (ref 0.40–4.50)

## 2016-09-24 LAB — CBC WITH DIFFERENTIAL/PLATELET
BASOS PCT: 1 %
Basophils Absolute: 90 cells/uL (ref 0–200)
EOS ABS: 90 {cells}/uL (ref 15–500)
Eosinophils Relative: 1 %
HEMATOCRIT: 46.6 % (ref 38.5–50.0)
HEMOGLOBIN: 15.6 g/dL (ref 13.2–17.1)
LYMPHS ABS: 2520 {cells}/uL (ref 850–3900)
Lymphocytes Relative: 28 %
MCH: 31 pg (ref 27.0–33.0)
MCHC: 33.5 g/dL (ref 32.0–36.0)
MCV: 92.5 fL (ref 80.0–100.0)
MONO ABS: 630 {cells}/uL (ref 200–950)
MPV: 10.4 fL (ref 7.5–12.5)
Monocytes Relative: 7 %
NEUTROS ABS: 5670 {cells}/uL (ref 1500–7800)
Neutrophils Relative %: 63 %
PLATELETS: 276 10*3/uL (ref 140–400)
RBC: 5.04 MIL/uL (ref 4.20–5.80)
RDW: 13.6 % (ref 11.0–15.0)
WBC: 9 10*3/uL (ref 3.8–10.8)

## 2016-09-24 NOTE — Progress Notes (Signed)
Assessment and Plan:  Hypothyroidism -cont levothyroxine -TSH  Vitamin D Def: -continue medications.   Continue diet and meds as discussed. Further disposition pending results of labs.  HPI 40 y.o. male  presents for 3 month follow up with hypothyroidism.  He reports that he has been taking his medication.  He does take it on an empty stomach and waits 30 minutes prior to eating.    Patient is on Vitamin D supplement.  Lab Results  Component Value Date   VD25OH 40 12/11/2015      Current Medications:  Current Outpatient Prescriptions on File Prior to Visit  Medication Sig Dispense Refill  . Cholecalciferol (VITAMIN D) 2000 UNITS tablet Take 10,000 Units by mouth daily.     Marland Kitchen. levothyroxine (SYNTHROID, LEVOTHROID) 50 MCG tablet Take 1 tablet daily on an empty stomach for 30 minutes 90 tablet 99  . Zinc 25 MG TABS Take 25 mg by mouth daily.     No current facility-administered medications on file prior to visit.     Medical History:  Past Medical History:  Diagnosis Date  . Hypogonadism male   . Kidney stone   . Thyroid disease     Allergies:  Allergies  Allergen Reactions  . Penicillins      Review of Systems:  Review of Systems  Constitutional: Negative for chills, fever and malaise/fatigue.  HENT: Negative for congestion, ear pain and sore throat.   Eyes: Negative.   Respiratory: Negative for cough, shortness of breath and wheezing.   Cardiovascular: Negative for chest pain, palpitations and leg swelling.  Gastrointestinal: Negative for abdominal pain, blood in stool, constipation, diarrhea, heartburn and melena.  Genitourinary: Negative.   Skin: Negative.   Neurological: Negative for dizziness, sensory change, loss of consciousness and headaches.  Psychiatric/Behavioral: Negative for depression. The patient is not nervous/anxious and does not have insomnia.     Family history- Review and unchanged  Social history- Review and unchanged  Physical Exam: BP  126/80   Pulse 68   Temp 98 F (36.7 C) (Temporal)   Resp 18   Ht 5\' 11"  (1.803 m)   Wt 186 lb (84.4 kg)   BMI 25.94 kg/m  Wt Readings from Last 3 Encounters:  09/24/16 186 lb (84.4 kg)  06/10/16 183 lb (83 kg)  05/01/16 184 lb (83.5 kg)    General Appearance: Well nourished well developed, in no apparent distress. Eyes: PERRLA, EOMs, conjunctiva no swelling or erythema ENT/Mouth: Ear canals normal without obstruction, swelling, erythma, discharge.  TMs normal bilaterally.  Oropharynx moist, clear, without exudate, or postoropharyngeal swelling. Neck: Supple, thyroid normal,no cervical adenopathy  Respiratory: Respiratory effort normal, Breath sounds clear A&P without rhonchi, wheeze, or rale.  No retractions, no accessory usage. Cardio: RRR with no MRGs. Brisk peripheral pulses without edema.  Abdomen: Soft, + BS,  Non tender, no guarding, rebound, hernias, masses. Musculoskeletal: Full ROM, 5/5 strength, Normal gait Skin: Warm, dry without rashes, lesions, ecchymosis.  Neuro: Awake and oriented X 3, Cranial nerves intact. Normal muscle tone, no cerebellar symptoms. Psych: Normal affect, Insight and Judgment appropriate.    Terri Piedraourtney Forcucci, PA-C 4:51 PM [facility]

## 2016-09-25 ENCOUNTER — Other Ambulatory Visit: Payer: Self-pay | Admitting: Internal Medicine

## 2016-09-25 LAB — HEPATIC FUNCTION PANEL
ALBUMIN: 4.5 g/dL (ref 3.6–5.1)
ALK PHOS: 74 U/L (ref 40–115)
ALT: 54 U/L — ABNORMAL HIGH (ref 9–46)
AST: 37 U/L (ref 10–40)
BILIRUBIN TOTAL: 0.4 mg/dL (ref 0.2–1.2)
Bilirubin, Direct: 0.1 mg/dL (ref <=0.2)
Indirect Bilirubin: 0.3 mg/dL (ref 0.2–1.2)
Total Protein: 7.5 g/dL (ref 6.1–8.1)

## 2016-09-25 LAB — BASIC METABOLIC PANEL WITH GFR
BUN: 16 mg/dL (ref 7–25)
CHLORIDE: 104 mmol/L (ref 98–110)
CO2: 23 mmol/L (ref 20–31)
Calcium: 9.4 mg/dL (ref 8.6–10.3)
Creat: 1.1 mg/dL (ref 0.60–1.35)
GFR, Est African American: >89 mL/min (ref >=60)
GFR, Est Non African American: 84 mL/min (ref >=60)
GLUCOSE: 90 mg/dL (ref 65–99)
POTASSIUM: 3.9 mmol/L (ref 3.5–5.3)
Sodium: 139 mmol/L (ref 135–146)

## 2016-09-25 MED ORDER — LEVOTHYROXINE SODIUM 75 MCG PO TABS: ORAL_TABLET | ORAL | 0 refills | Status: DC

## 2017-01-03 ENCOUNTER — Other Ambulatory Visit: Payer: Self-pay | Admitting: Internal Medicine

## 2017-01-04 ENCOUNTER — Other Ambulatory Visit: Payer: Self-pay | Admitting: Internal Medicine

## 2017-01-06 ENCOUNTER — Encounter: Payer: Self-pay | Admitting: Internal Medicine

## 2017-01-06 ENCOUNTER — Ambulatory Visit (INDEPENDENT_AMBULATORY_CARE_PROVIDER_SITE_OTHER): Payer: 59 | Admitting: Internal Medicine

## 2017-01-06 VITALS — BP 110/80 | HR 84 | Temp 97.3°F | Resp 16 | Ht 71.25 in | Wt 182.8 lb

## 2017-01-06 DIAGNOSIS — Z0001 Encounter for general adult medical examination with abnormal findings: Secondary | ICD-10-CM

## 2017-01-06 DIAGNOSIS — E349 Endocrine disorder, unspecified: Secondary | ICD-10-CM

## 2017-01-06 DIAGNOSIS — E559 Vitamin D deficiency, unspecified: Secondary | ICD-10-CM

## 2017-01-06 DIAGNOSIS — E782 Mixed hyperlipidemia: Secondary | ICD-10-CM

## 2017-01-06 DIAGNOSIS — Z136 Encounter for screening for cardiovascular disorders: Secondary | ICD-10-CM | POA: Diagnosis not present

## 2017-01-06 DIAGNOSIS — Z125 Encounter for screening for malignant neoplasm of prostate: Secondary | ICD-10-CM

## 2017-01-06 DIAGNOSIS — R030 Elevated blood-pressure reading, without diagnosis of hypertension: Secondary | ICD-10-CM

## 2017-01-06 DIAGNOSIS — Z111 Encounter for screening for respiratory tuberculosis: Secondary | ICD-10-CM

## 2017-01-06 DIAGNOSIS — R5383 Other fatigue: Secondary | ICD-10-CM

## 2017-01-06 DIAGNOSIS — Z79899 Other long term (current) drug therapy: Secondary | ICD-10-CM

## 2017-01-06 DIAGNOSIS — Z0000 Encounter for general adult medical examination without abnormal findings: Secondary | ICD-10-CM | POA: Diagnosis not present

## 2017-01-06 DIAGNOSIS — E039 Hypothyroidism, unspecified: Secondary | ICD-10-CM

## 2017-01-06 DIAGNOSIS — Z1212 Encounter for screening for malignant neoplasm of rectum: Secondary | ICD-10-CM

## 2017-01-06 DIAGNOSIS — R7309 Other abnormal glucose: Secondary | ICD-10-CM

## 2017-01-06 LAB — VITAMIN B12: Vitamin B-12: 838 pg/mL (ref 200–1100)

## 2017-01-06 LAB — IRON AND TIBC
%SAT: 24 % (ref 15–60)
IRON: 80 ug/dL (ref 50–180)
TIBC: 335 ug/dL (ref 250–425)
UIBC: 255 ug/dL (ref 125–400)

## 2017-01-06 LAB — HEPATIC FUNCTION PANEL
ALBUMIN: 4.2 g/dL (ref 3.6–5.1)
ALT: 32 U/L (ref 9–46)
AST: 26 U/L (ref 10–40)
Alkaline Phosphatase: 74 U/L (ref 40–115)
Bilirubin, Direct: 0.1 mg/dL (ref <=0.2)
Indirect Bilirubin: 0.4 mg/dL (ref 0.2–1.2)
TOTAL PROTEIN: 7 g/dL (ref 6.1–8.1)
Total Bilirubin: 0.5 mg/dL (ref 0.2–1.2)

## 2017-01-06 LAB — CBC WITH DIFFERENTIAL/PLATELET
BASOS ABS: 95 {cells}/uL (ref 0–200)
Basophils Relative: 1 %
EOS ABS: 0 {cells}/uL — AB (ref 15–500)
Eosinophils Relative: 0 %
HEMATOCRIT: 45.3 % (ref 38.5–50.0)
HEMOGLOBIN: 15.1 g/dL (ref 13.2–17.1)
Lymphocytes Relative: 17 %
Lymphs Abs: 1615 cells/uL (ref 850–3900)
MCH: 30.6 pg (ref 27.0–33.0)
MCHC: 33.3 g/dL (ref 32.0–36.0)
MCV: 91.7 fL (ref 80.0–100.0)
MONOS PCT: 8 %
MPV: 10.2 fL (ref 7.5–12.5)
Monocytes Absolute: 760 cells/uL (ref 200–950)
NEUTROS ABS: 7030 {cells}/uL (ref 1500–7800)
Neutrophils Relative %: 74 %
PLATELETS: 269 10*3/uL (ref 140–400)
RBC: 4.94 MIL/uL (ref 4.20–5.80)
RDW: 13.1 % (ref 11.0–15.0)
WBC: 9.5 10*3/uL (ref 3.8–10.8)

## 2017-01-06 LAB — BASIC METABOLIC PANEL WITH GFR
BUN: 15 mg/dL (ref 7–25)
CHLORIDE: 104 mmol/L (ref 98–110)
CO2: 29 mmol/L (ref 20–31)
Calcium: 9 mg/dL (ref 8.6–10.3)
Creat: 1.13 mg/dL (ref 0.60–1.35)
GFR, Est African American: >89 mL/min (ref >=60)
GFR, Est Non African American: 81 mL/min (ref >=60)
GLUCOSE: 85 mg/dL (ref 65–99)
Potassium: 4.3 mmol/L (ref 3.5–5.3)
Sodium: 141 mmol/L (ref 135–146)

## 2017-01-06 LAB — LIPID PANEL
Cholesterol: 142 mg/dL (ref <200)
HDL: 41 mg/dL (ref >40)
LDL CALC: 80 mg/dL (ref <100)
Total CHOL/HDL Ratio: 3.5 Ratio (ref <5.0)
Triglycerides: 104 mg/dL (ref <150)
VLDL: 21 mg/dL (ref <30)

## 2017-01-06 LAB — TSH: TSH: 1.83 m[IU]/L (ref 0.40–4.50)

## 2017-01-06 LAB — PSA: PSA: 1.3 ng/mL (ref <=4.0)

## 2017-01-06 LAB — HEMOGLOBIN A1C
Hgb A1c MFr Bld: 4.9 % (ref <5.7)
MEAN PLASMA GLUCOSE: 94 mg/dL

## 2017-01-06 NOTE — Patient Instructions (Addendum)
Preventive Care for Adults  A healthy lifestyle and preventive care can promote health and wellness. Preventive health guidelines for men include the following key practices:  A routine yearly physical is a good way to check with your health care provider about your health and preventative screening. It is a chance to share any concerns and updates on your health and to receive a thorough exam.  Visit your dentist for a routine exam and preventative care every 6 months. Brush your teeth twice a day and floss once a day. Good oral hygiene prevents tooth decay and gum disease.  The frequency of eye exams is based on your age, health, family medical history, use of contact lenses, and other factors. Follow your health care provider's recommendations for frequency of eye exams.  Eat a healthy diet. Foods such as vegetables, fruits, whole grains, low-fat dairy products, and lean protein foods contain the nutrients you need without too many calories. Decrease your intake of foods high in solid fats, added sugars, and salt. Eat the right amount of calories for you.Get information about a proper diet from your health care provider, if necessary.  Regular physical exercise is one of the most important things you can do for your health. Most adults should get at least 150 minutes of moderate-intensity exercise (any activity that increases your heart rate and causes you to sweat) each week. In addition, most adults need muscle-strengthening exercises on 2 or more days a week.  Maintain a healthy weight. The body mass index (BMI) is a screening tool to identify possible weight problems. It provides an estimate of body fat based on height and weight. Your health care provider can find your BMI and can help you achieve or maintain a healthy weight.For adults 20 years and older:  A BMI below 18.5 is considered underweight.  A BMI of 18.5 to 24.9 is normal.  A BMI of 25 to 29.9 is considered overweight.  A  BMI of 30 and above is considered obese.  Maintain normal blood lipids and cholesterol levels by exercising and minimizing your intake of saturated fat. Eat a balanced diet with plenty of fruit and vegetables. Blood tests for lipids and cholesterol should begin at age 20 and be repeated every 5 years. If your lipid or cholesterol levels are high, you are over 50, or you are at high risk for heart disease, you may need your cholesterol levels checked more frequently.Ongoing high lipid and cholesterol levels should be treated with medicines if diet and exercise are not working.  If you smoke, find out from your health care provider how to quit. If you do not use tobacco, do not start.  Lung cancer screening is recommended for adults aged 55-80 years who are at high risk for developing lung cancer because of a history of smoking. A yearly low-dose CT scan of the lungs is recommended for people who have at least a 30-pack-year history of smoking and are a current smoker or have quit within the past 15 years. A pack year of smoking is smoking an average of 1 pack of cigarettes a day for 1 year (for example: 1 pack a day for 30 years or 2 packs a day for 15 years). Yearly screening should continue until the smoker has stopped smoking for at least 15 years. Yearly screening should be stopped for people who develop a health problem that would prevent them from having lung cancer treatment.  If you choose to drink alcohol, do not have more   than 2 drinks per day. One drink is considered to be 12 ounces (355 mL) of beer, 5 ounces (148 mL) of wine, or 1.5 ounces (44 mL) of liquor.  Avoid use of street drugs. Do not share needles with anyone. Ask for help if you need support or instructions about stopping the use of drugs.  High blood pressure causes heart disease and increases the risk of stroke. Your blood pressure should be checked at least every 1-2 years. Ongoing high blood pressure should be treated with  medicines, if weight loss and exercise are not effective.  If you are 47-70 years old, ask your health care provider if you should take aspirin to prevent heart disease.  Diabetes screening involves taking a blood sample to check your fasting blood sugar level. This should be done once every 3 years, after age 4, if you are within normal weight and without risk factors for diabetes. Testing should be considered at a younger age or be carried out more frequently if you are overweight and have at least 1 risk factor for diabetes.  Colorectal cancer can be detected and often prevented. Most routine colorectal cancer screening begins at the age of 64 and continues through age 39. However, your health care provider may recommend screening at an earlier age if you have risk factors for colon cancer. On a yearly basis, your health care provider may provide home test kits to check for hidden blood in the stool. Use of a small camera at the end of a tube to directly examine the colon (sigmoidoscopy or colonoscopy) can detect the earliest forms of colorectal cancer. Talk to your health care provider about this at age 38, when routine screening begins. Direct exam of the colon should be repeated every 5-10 years through age 54, unless early forms of precancerous polyps or small growths are found.   Talk with your health care provider about prostate cancer screening.  Testicular cancer screening isrecommended for adult males. Screening includes self-exam, a health care provider exam, and other screening tests. Consult with your health care provider about any symptoms you have or any concerns you have about testicular cancer.  Use sunscreen. Apply sunscreen liberally and repeatedly throughout the day. You should seek shade when your shadow is shorter than you. Protect yourself by wearing long sleeves, pants, a wide-brimmed hat, and sunglasses year round, whenever you are outdoors.  Once a month, do a whole-body  skin exam, using a mirror to look at the skin on your back. Tell your health care provider about new moles, moles that have irregular borders, moles that are larger than a pencil eraser, or moles that have changed in shape or color.  Stay current with required vaccines (immunizations).  Influenza vaccine. All adults should be immunized every year.  Tetanus, diphtheria, and acellular pertussis (Td, Tdap) vaccine. An adult who has not previously received Tdap or who does not know his vaccine status should receive 1 dose of Tdap. This initial dose should be followed by tetanus and diphtheria toxoids (Td) booster doses every 10 years. Adults with an unknown or incomplete history of completing a 3-dose immunization series with Td-containing vaccines should begin or complete a primary immunization series including a Tdap dose. Adults should receive a Td booster every 10 years.  Varicella vaccine. An adult without evidence of immunity to varicella should receive 2 doses or a second dose if he has previously received 1 dose.  Human papillomavirus (HPV) vaccine. Males aged 13-21 years who have not  received the vaccine previously should receive the 3-dose series. Males aged 22-26 years may be immunized. Immunization is recommended through the age of 26 years for any male who has sex with males and did not get any or all doses earlier. Immunization is recommended for any person with an immunocompromised condition through the age of 26 years if he did not get any or all doses earlier. During the 3-dose series, the second dose should be obtained 4-8 weeks after the first dose. The third dose should be obtained 24 weeks after the first dose and 16 weeks after the second dose.  Zoster vaccine. One dose is recommended for adults aged 60 years or older unless certain conditions are present.    PREVNAR  - Pneumococcal 13-valent conjugate (PCV13) vaccine. When indicated, a person who is uncertain of his immunization  history and has no record of immunization should receive the PCV13 vaccine. An adult aged 19 years or older who has certain medical conditions and has not been previously immunized should receive 1 dose of PCV13 vaccine. This PCV13 should be followed with a dose of pneumococcal polysaccharide (PPSV23) vaccine. The PPSV23 vaccine dose should be obtained at least 8 weeks after the dose of PCV13 vaccine. An adult aged 19 years or older who has certain medical conditions and previously received 1 or more doses of PPSV23 vaccine should receive 1 dose of PCV13. The PCV13 vaccine dose should be obtained 1 or more years after the last PPSV23 vaccine dose.    PNEUMOVAX - Pneumococcal polysaccharide (PPSV23) vaccine. When PCV13 is also indicated, PCV13 should be obtained first. All adults aged 65 years and older should be immunized. An adult younger than age 65 years who has certain medical conditions should be immunized. Any person who resides in a nursing home or long-term care facility should be immunized. An adult smoker should be immunized. People with an immunocompromised condition and certain other conditions should receive both PCV13 and PPSV23 vaccines. People with human immunodeficiency virus (HIV) infection should be immunized as soon as possible after diagnosis. Immunization during chemotherapy or radiation therapy should be avoided. Routine use of PPSV23 vaccine is not recommended for American Indians, [place] Natives, or people younger than 65 years unless there are medical conditions that require PPSV23 vaccine. When indicated, people who have unknown immunization and have no record of immunization should receive PPSV23 vaccine. One-time revaccination 5 years after the first dose of PPSV23 is recommended for people aged 19-64 years who have chronic kidney failure, nephrotic syndrome, asplenia, or immunocompromised conditions. People who received 1-2 doses of PPSV23 before age 65 years should receive another  dose of PPSV23 vaccine at age 65 years or later if at least 5 years have passed since the previous dose. Doses of PPSV23 are not needed for people immunized with PPSV23 at or after age 65 years.    Hepatitis A vaccine. Adults who wish to be protected from this disease, have certain high-risk conditions, work with hepatitis A-infected animals, work in hepatitis A research labs, or travel to or work in countries with a high rate of hepatitis A should be immunized. Adults who were previously unvaccinated and who anticipate close contact with an international adoptee during the first 60 days after arrival in the [place] from a country with a high rate of hepatitis A should be immunized.    Hepatitis B vaccine. Adults should be immunized if they wish to be protected from this disease, have certain high-risk conditions, may be exposed to   blood or other infectious body fluids, are household contacts or sex partners of hepatitis B positive people, are clients or workers in certain care facilities, or travel to or work in countries with a high rate of hepatitis B.   Preventive Service / Frequency   Ages 40 to 64  Blood pressure check.  Lipid and cholesterol check  Lung cancer screening. / Every year if you are aged 55-80 years and have a 30-pack-year history of smoking and currently smoke or have quit within the past 15 years. Yearly screening is stopped once you have quit smoking for at least 15 years or develop a health problem that would prevent you from having lung cancer treatment.  Fecal occult blood test (FOBT) of stool. / Every year beginning at age 50 and continuing until age 75. You may not have to do this test if you get a colonoscopy every 10 years.  Flexible sigmoidoscopy** or colonoscopy.** / Every 5 years for a flexible sigmoidoscopy or every 10 years for a colonoscopy beginning at age 50 and continuing until age 75. Screening for abdominal aortic aneurysm (AAA)  by ultrasound is  recommended for people who have history of high blood pressure or who are current or former smokers. +++++++++++ Recommend Adult Low Dose Aspirin or  coated  Aspirin 81 mg daily  To reduce risk of Colon Cancer 20 %,  Skin Cancer 26 % ,  Melanoma 46%  and  Pancreatic cancer 60% ++++++++++++++++++++ Vitamin D goal  is between 70-100.  Please make sure that you are taking your Vitamin D as directed.  It is very important as a natural anti-inflammatory  helping hair, skin, and nails, as well as reducing stroke and heart attack risk.  It helps your bones and helps with mood. It also decreases numerous cancer risks so please take it as directed.  Low Vit D is associated with a 200-300% higher risk for CANCER  and 200-300% higher risk for HEART   ATTACK  &  STROKE.   ...................................... It is also associated with higher death rate at younger ages,  autoimmune diseases like Rheumatoid arthritis, Lupus, Multiple Sclerosis.    Also many other serious conditions, like depression, Alzheimer's Dementia, infertility, muscle aches, fatigue, fibromyalgia - just to name a few. +++++++++++++++++++++ Recommend the book "The END of DIETING" by Dr Joel Fuhrman  & the book "The END of DIABETES " by Dr Joel Fuhrman At [employer] - get book & Audio CD's    Being diabetic has a  300% increased risk for heart attack, stroke, cancer, and alzheimer- type vascular dementia. It is very important that you work harder with diet by avoiding all foods that are white. Avoid white rice (brown & wild rice is OK), white potatoes (sweetpotatoes in moderation is OK), White bread or wheat bread or anything made out of white flour like bagels, donuts, rolls, buns, biscuits, cakes, pastries, cookies, pizza crust, and pasta (made from white flour & egg whites) - vegetarian pasta or spinach or wheat pasta is OK. Multigrain breads like Arnold's or [employer], or multigrain sandwich thins or flatbreads.  Diet,  exercise and weight loss can reverse and cure diabetes in the early stages.  Diet, exercise and weight loss is very important in the control and prevention of complications of diabetes which affects every system in your body, ie. Brain - dementia/stroke, eyes - glaucoma/blindness, heart - heart attack/heart failure, kidneys - dialysis, stomach - gastric paralysis, intestines - malabsorption, nerves - severe painful neuritis, circulation -   gangrene & loss of a leg(s), and finally cancer and Alzheimers.    I recommend avoid fried & greasy foods,  sweets/candy, white rice (brown or wild rice or Quinoa is OK), white potatoes (sweet potatoes are OK) - anything made from white flour - bagels, doughnuts, rolls, buns, biscuits,white and wheat breads, pizza crust and traditional pasta made of white flour & egg white(vegetarian pasta or spinach or wheat pasta is OK).  Multi-grain bread is OK - like multi-grain flat bread or sandwich thins. Avoid alcohol in excess. Exercise is also important.    Eat all the vegetables you want - avoid meat, especially red meat and dairy - especially cheese.  Cheese is the most concentrated form of trans-fats which is the worst thing to clog up our arteries. Veggie cheese is OK which can be found in the fresh produce section at [employer] or [employer] or [employer]  ++++++++++++++++++++++ DASH Eating Plan  DASH stands for "Dietary Approaches to Stop Hypertension."   The DASH eating plan is a healthy eating plan that has been shown to reduce high blood pressure (hypertension). Additional health benefits may include reducing the risk of type 2 diabetes mellitus, heart disease, and stroke. The DASH eating plan may also help with weight loss. WHAT DO I NEED TO KNOW ABOUT THE DASH EATING PLAN? For the DASH eating plan, you will follow these general guidelines:  Choose foods with a percent daily value for sodium of less than 5% (as listed on the food label).  Use salt-free  seasonings or herbs instead of table salt or sea salt.  Check with your health care provider or pharmacist before using salt substitutes.  Eat lower-sodium products, often labeled as "lower sodium" or "no salt added."  Eat fresh foods.  Eat more vegetables, fruits, and low-fat dairy products.  Choose whole grains. Look for the word "whole" as the first word in the ingredient list.  Choose fish   Limit sweets, desserts, sugars, and sugary drinks.  Choose heart-healthy fats.  Eat veggie cheese   Eat more home-cooked food and less restaurant, buffet, and fast food.  Limit fried foods.  Cook foods using methods other than frying.  Limit canned vegetables. If you do use them, rinse them well to decrease the sodium.  When eating at a restaurant, ask that your food be prepared with less salt, or no salt if possible.                      WHAT FOODS CAN I EAT? Read Dr Joel Fuhrman's books on The End of Dieting & The End of Diabetes  Grains Whole grain or whole wheat bread. Brown rice. Whole grain or whole wheat pasta. Quinoa, bulgur, and whole grain cereals. Low-sodium cereals. Corn or whole wheat flour tortillas. Whole grain cornbread. Whole grain crackers. Low-sodium crackers.  Vegetables Fresh or frozen vegetables (raw, steamed, roasted, or grilled). Low-sodium or reduced-sodium tomato and vegetable juices. Low-sodium or reduced-sodium tomato sauce and paste. Low-sodium or reduced-sodium canned vegetables.   Fruits All fresh, canned (in natural juice), or frozen fruits.  Protein Products  All fish and seafood.  Dried beans, peas, or lentils. Unsalted nuts and seeds. Unsalted canned beans.  Dairy Low-fat dairy products, such as skim or 1% milk, 2% or reduced-fat cheeses, low-fat ricotta or cottage cheese, or plain low-fat yogurt. Low-sodium or reduced-sodium cheeses.  Fats and Oils Tub margarines without trans fats. Light or reduced-fat mayonnaise and salad dressings  (reduced sodium). Avocado. Safflower,   olive, or canola oils. Natural peanut or almond butter.  Other Unsalted popcorn and pretzels. The items listed above may not be a complete list of recommended foods or beverages. Contact your dietitian for more options.  +++++++++++++++++++  WHAT FOODS ARE NOT RECOMMENDED? Grains/ White flour or wheat flour White bread. White pasta. White rice. Refined cornbread. Bagels and croissants. Crackers that contain trans fat.  Vegetables  Creamed or fried vegetables. Vegetables in a . Regular canned vegetables. Regular canned tomato sauce and paste. Regular tomato and vegetable juices.  Fruits Dried fruits. Canned fruit in light or heavy syrup. Fruit juice.  Meat and Other Protein Products Meat in general - RED meat & White meat.  Fatty cuts of meat. Ribs, chicken wings, all processed meats as bacon, sausage, bologna, salami, fatback, hot dogs, bratwurst and packaged luncheon meats.  Dairy Whole or 2% milk, cream, half-and-half, and cream cheese. Whole-fat or sweetened yogurt. Full-fat cheeses or blue cheese. Non-dairy creamers and whipped toppings. Processed cheese, cheese spreads, or cheese curds.  Condiments Onion and garlic salt, seasoned salt, table salt, and sea salt. Canned and packaged gravies. Worcestershire sauce. Tartar sauce. Barbecue sauce. Teriyaki sauce. Soy sauce, including reduced sodium. Steak sauce. Fish sauce. Oyster sauce. Cocktail sauce. Horseradish. Ketchup and mustard. Meat flavorings and tenderizers. Bouillon cubes. Hot sauce. Tabasco sauce. Marinades. Taco seasonings. Relishes.  Fats and Oils Butter, stick margarine, lard, shortening and bacon fat. Coconut, palm kernel, or palm oils. Regular salad dressings.  Pickles and olives. Salted popcorn and pretzels.  The items listed above may not be a complete list of foods and beverages to avoid.  9 Ways to Naturally Increase Testosterone Levels  1.   Lose Weight  If you're  overweight, shedding the excess pounds may increase your testosterone levels, according to research presented at the Endocrine Society's 2012 meeting. Overweight men are more likely to have low testosterone levels to begin with, so this is an important trick to increase your body's testosterone production when you need it most.  2.   High-Intensity Exercise like [employer]itness   Short intense exercise has a proven positive effect on increasing testosterone levels and preventing its decline. That's unlike aerobics or prolonged moderate exercise, which have shown to have negative or no effect on testosterone levels. Having a whey protein meal after exercise can further enhance the satiety/testosterone-boosting impact (hunger hormones cause the opposite effect on your testosterone and libido). Here's a summary of what a typical high-intensity [employer] routine might look like: " Warm up for three minutes  " Exercise as hard and fast as you can for 30 seconds. You should feel like you couldn't possibly go on another few seconds  " Recover at a slow to moderate pace for 90 seconds  " Repeat the high intensity exercise and recovery 7 more times .  3.   Consume Plenty of Zinc  The mineral zinc is important for testosterone production, and supplementing your diet for as little as six weeks has been shown to cause a marked improvement in testosterone among men with low levels.1 Likewise, research has shown that restricting dietary sources of zinc leads to a significant decrease in testosterone, while zinc supplementation increases it - and even protects men from exercised-induced reductions in testosterone levels.  It's estimated that up to 45 percent of adults over the age of 60 may have lower than recommended zinc intakes; even when dietary supplements were added in, an estimated 20-25 percent of older adults still had inadequate zinc  intakes, according to a [employer] and Nutrition Examination  Survey.4 Your diet is the best source of zinc; along with protein-rich foods like  fish, other good dietary sources of zinc include raw milk, raw cheese, beans, and yogurt or kefir made from raw milk. It can be difficult to obtain enough dietary zinc if you're a vegetarian, and also for meat-eaters as well, largely because of conventional farming methods that rely heavily on chemical fertilizers and pesticides. These chemicals deplete the soil of nutrients ... nutrients like zinc that must be absorbed by plants in order to be passed on to you. In many cases, you may further deplete the nutrients in your food by the way you prepare it. For most food, cooking it will drastically reduce its levels of nutrients like zinc . particularly over-cooking, which many people do. If you decide to use a zinc supplement, stick to a dosage of 50 mg a day, as this is the recommended adult dose. Taking too much zinc can interfere with your body's ability to absorb other minerals, especially copper, and may cause nausea as a side effect.  4.   Strength Training  In addition to [employer], strength training is also known to boost testosterone levels, provided you are doing so intensely enough. When strength training to boost testosterone, you'll want to increase the weight and lower your number of reps, and then focus on exercises that work a large number of muscles, such as dead lifts or squats.  You can "turbo-charge" your weight training by going slower. By slowing down your movement, you're actually turning it into a high-intensity exercise. Super Slow movement allows your muscle, at the microscopic level, to access the maximum number of cross-bridges between the protein filaments that produce movement in the muscle.   5.   Optimize Your Vitamin D Levels  Vitamin D, a steroid hormone, is essential for the healthy development of the nucleus of the sperm cell, and helps maintain semen quality and sperm count. Vitamin D  also increases levels of testosterone, which may boost libido. In one study, overweight men who were given vitamin D supplements had a significant increase in testosterone levels after one year.5   6.   Reduce Stress  When you're under a lot of stress, your body releases high levels of the stress hormone cortisol. This hormone actually blocks the effects of testosterone,6 presumably because, from a biological standpoint, testosterone-associated behaviors (mating, competing, aggression) may have lowered your chances of survival in an emergency (hence, the "fight or flight" response is dominant, courtesy of cortisol).  7.   Limit or Eliminate Sugar from Your Diet  Testosterone levels decrease after you eat sugar, which is likely because the sugar leads to a high insulin level, another factor leading to low testosterone.7 Based on USDA estimates, the average American consumes 12 teaspoons of sugar a day, which equates to about TWO TONS of sugar during a lifetime.  8.   Eat Healthy Fats  By healthy, this means not only mono- and polyunsaturated fats, like that found in avocadoes and nuts, but also saturated, as these are essential for building testosterone. Research shows that a diet with less than 40 percent of energy as fat (and that mainly from animal sources, i.e. saturated) lead to a decrease in testosterone levels. ie eat less animal products - as Meat , poultry and dairy. Experts agree that the ideal diet includes somewhere between 50-70 percent fat.  It's important to understand that your body requires saturated  fats from animal and vegetable sources (such as meat, dairy, certain oils, and tropical plants like coconut) for optimal functioning, and if you neglect this important food group in favor of sugar, grains and other starchy carbs, your health and weight are almost guaranteed to suffer. Examples of healthy fats you can eat more of to give your testosterone levels a boost include: Olives and  Olive oil  Coconuts and coconut oil Butter made from raw grass-fed organic milk Raw nuts, such as, almonds or pecans Organic pastured egg yolks Avocados Grass-fed meats Palm oil Unheated organic nut oils   9.   Boost Your Intake of Branch Chain Amino Acids (BCAA) from Foods Like Whey Protein Research suggests that BCAAs result in higher testosterone levels, particularly when taken along with resistance training. While BCAAs are available in supplement form, you'll find the highest concentrations of BCAAs like leucine in whey protein. Even when getting leucine from your natural food supply, it's often wasted or used as a building block instead of an anabolic agent. So to create the correct anabolic environment, you need to boost leucine consumption way beyond mere maintenance levels. That said, keep in mind that using leucine as a free form amino acid can be highly counterproductive as when free form amino acids are artificially administrated, they rapidly enter your circulation while disrupting insulin function, and impairing your body's glycemic control. Food-based leucine is really the ideal form that can benefit your muscles without side effects.

## 2017-01-06 NOTE — Progress Notes (Signed)
[name]   Ryan Lam, M.D.    Dyanne Carrel. Steffanie Dunn, P.A.-C      Terri Piedra, P.A.-C  [facility]                [address]                [place], [place]. [address] Telephone [phone] Telefax [phone] Annual  Screening/Preventative Visit  & Comprehensive Evaluation & Examination     This very nice 41 y.o. MWM presents for a Screening/Preventative Visit & comprehensive evaluation and management of multiple medical co-morbidities.  Patient is being screened for elevated BP,  Prediabetes, Hyperlipidemia, Low Testosterone, hypothyroidism  and Vitamin D Deficiency.     Patient has hx/o borderline elevated BP's in the past and is monitored expectantly.  Patient's BP has been controlled at home.  Today's BP is at goal - 110/80. Patient denies any cardiac symptoms as chest pain, palpitations, shortness of breath, dizziness or ankle swelling.     Patient's hyperlipidemia is controlled with diet. Patient denies myalgias or other medication SE's. Last lipids were at goal albeit slightly elevated Trig's: Lab Results  Component Value Date   CHOL 151 06/10/2016   HDL 44 06/10/2016   LDLCALC 70 06/10/2016   TRIG 187 (H) 06/10/2016   CHOLHDL 3.4 06/10/2016      Patient is monitored expectantly for prediabetes and patient denies reactive hypoglycemic symptoms, visual blurring, diabetic polys or paresthesias. Last A1c was normal & at goal: Lab Results  Component Value Date   HGBA1C 5.1 12/11/2015       Patient has been on thyroid replacement since 2013. [place] ., he has hx/o Low Testosterone [phone]) over the last several years, but has no sx's of fatigability, depressed mood,  decreased libido, ED or lack of fertility, so replacement therapy has been deferred. Finally, patient has history of Vitamin D Deficiency and supplements sporadically and consequently last vitamin D was still low:  Lab Results  Component  Value Date   VD25OH 40 12/11/2015   Medication Sig  . levothyroxine  75 MCG tablet TAKE ONE TABLET BY MOUTH DAILY  . Zinc 25 MG TABS Take 25 mg by mouth daily.   Allergies  Allergen Reactions  . Penicillins    Past Medical History:  Diagnosis Date  . Hypogonadism male   . Kidney stone   . Thyroid disease    Health Maintenance  Topic Date Due  . TETANUS/TDAP  01/15/2022  . INFLUENZA VACCINE  Completed  . HIV Screening  Completed   Immunization History  Administered Date(s) Administered  . Influenza Split 09/30/2014  . Influenza,inj,quad, With Preservative 09/24/2016  . PPD Test 12/11/2015  . Pneumococcal Polysaccharide-23 01/16/2012  . Tdap 01/16/2012   Past Surgical History:  Procedure Laterality Date  . LASIK  2008   Family History  Problem Relation Age of Onset  . Cancer Mother     hx breast  . COPD Father   . AAA (abdominal aortic aneurysm) Father    Social History   Social History  . Marital status: Married  . Number of children: Has 2 daughters - 8 months & 2 yo   Occupational History  . 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 Smoking status: Never Smoker  . Smokeless tobacco: No  . Alcohol use 6.0 oz/week    10 Standard drinks or equivalent per week  . Drug use: No  .  Sexual activity: No    ROS Constitutional: Denies fever, chills, weight loss/gain, headaches, insomnia,  night sweats or change in appetite. Does c/o fatigue. Eyes: Denies redness, blurred vision, diplopia, discharge, itchy or watery eyes.  ENT: Denies discharge, congestion, post nasal drip, epistaxis, sore throat, earache, hearing loss, dental pain, Tinnitus, Vertigo, Sinus pain or snoring.  Cardio: Denies chest pain, palpitations, irregular heartbeat, syncope, dyspnea, diaphoresis, orthopnea, PND, claudication or edema Respiratory: denies cough, dyspnea, DOE, pleurisy, hoarseness, laryngitis or wheezing.  Gastrointestinal: Denies dysphagia, heartburn,  reflux, water brash, pain, cramps, nausea, vomiting, bloating, diarrhea, constipation, hematemesis, melena, hematochezia, jaundice or hemorrhoids Genitourinary: Denies dysuria, frequency, urgency, nocturia, hesitancy, discharge, hematuria or flank pain Musculoskeletal: Denies arthralgia, myalgia, stiffness, Jt. Swelling, pain, limp or strain/sprain. Denies Falls. Skin: Denies puritis, rash, hives, warts, acne, eczema or change in skin lesion Neuro: No weakness, tremor, incoordination, spasms, paresthesia or pain Psychiatric: Denies confusion, memory loss or sensory loss. Denies Depression. Endocrine: Denies change in weight, skin, hair change, nocturia, and paresthesia, diabetic polys, visual blurring or hyper / hypo glycemic episodes.  Heme/Lymph: No excessive bleeding, bruising or enlarged lymph nodes.  Physical Exam  BP 110/80   Pulse 84   Temp 97.3 F (36.3 C)   Resp 16   Ht 5' 11.25" (1.81 m)   Wt 182 lb 12.8 oz (82.9 kg)   BMI 25.32 kg/m   General Appearance: Well nourished, in no apparent distress.  Eyes: PERRLA, EOMs, conjunctiva no swelling or erythema, normal fundi and vessels. Sinuses: No frontal/maxillary tenderness ENT/Mouth: EACs patent / TMs  nl. Nares clear without erythema, swelling, mucoid exudates. Oral hygiene is good. No erythema, swelling, or exudate. Tongue normal, non-obstructing. Tonsils not swollen or erythematous. Hearing normal.  Neck: Supple, thyroid normal. No bruits, nodes or JVD. Respiratory: Respiratory effort normal.  BS equal and clear bilateral without rales, rhonci, wheezing or stridor. Cardio: Heart sounds are normal with regular rate and rhythm and no murmurs, rubs or gallops. Peripheral pulses are normal and equal bilaterally without edema. No aortic or femoral bruits. Chest: symmetric with normal excursions and percussion.  Abdomen: Soft, with Nl bowel sounds. Nontender, no guarding, rebound, hernias, masses, or organomegaly.  Lymphatics: Non  tender without lymphadenopathy.  Genitourinary: No hernias.Testes nl. DRE - prostate nl for age - smooth & firm w/o nodules. Musculoskeletal: Full ROM all peripheral extremities, joint stability, 5/5 strength, and normal gait. Skin: Warm and dry without rashes, lesions, cyanosis, clubbing or  ecchymosis.  Neuro: Cranial nerves intact, reflexes equal bilaterally. Normal muscle tone, no cerebellar symptoms. Sensation intact.  Pysch: Alert and oriented X 3 with normal affect, insight and judgment appropriate.   Assessment and Plan  1. Annual Preventative/Screening Exam    2. Elevated BP without diagnosis of hypertension  - Microalbumin / creatinine urine ratio - EKG 12-Lead - Urinalysis, Routine w reflex microscopic - CBC with Differential/Platelet - BASIC METABOLIC PANEL WITH GFR - Magnesium  3. Mixed hyperlipidemia  - EKG 12-Lead - Hepatic function panel - Lipid panel  4. Other abnormal glucose  - EKG 12-Lead - Hemoglobin A1c - Insulin, random  5. Vitamin D deficiency  - VITAMIN D 25 Hydroxy   6. Hypothyroidism  - TSH  7. Testosterone deficiency  - Testosterone  8. Screening for rectal cancer  - POC Hemoccult Bld/Stl  9. Screening for ischemic heart disease  - EKG 12-Lead  10. Fatigue  - Vitamin B12 - Iron and TIBC - Testosterone - CBC with Differential/Platelet  11. Screening examination for  pulmonary tuberculosis  - PPD  12. Medication management  - CBC with Differential/Platelet - BASIC METABOLIC PANEL WITH GFR - Hepatic function panel - Magnesium - Lipid panel - Hemoglobin A1c - Insulin, random - VITAMIN D 25 Hydroxy   13. Screening for malignant neoplasm of prostate  - PSA      Continue prudent diet as discussed, weight control, BP monitoring, regular exercise, and medications as discussed.  Discussed med effects and SE's. Routine screening labs and tests as requested with regular follow-up as recommended. Over 40 minutes of exam,  counseling, chart review and high complex critical decision making was performed

## 2017-01-07 LAB — MICROALBUMIN / CREATININE URINE RATIO
CREATININE, URINE: 163 mg/dL (ref 20–370)
MICROALB/CREAT RATIO: 6 ug/mg{creat} (ref <30)
Microalb, Ur: 1 mg/dL

## 2017-01-07 LAB — URINALYSIS, ROUTINE W REFLEX MICROSCOPIC
BILIRUBIN URINE: NEGATIVE
Glucose, UA: NEGATIVE
HGB URINE DIPSTICK: NEGATIVE
KETONES UR: NEGATIVE
Leukocytes, UA: NEGATIVE
NITRITE: NEGATIVE
PROTEIN: NEGATIVE
SPECIFIC GRAVITY, URINE: 1.019 (ref 1.001–1.035)
pH: 6 (ref 5.0–8.0)

## 2017-01-07 LAB — INSULIN, RANDOM: Insulin: 34.8 u[IU]/mL — ABNORMAL HIGH (ref 2.0–19.6)

## 2017-01-07 LAB — MAGNESIUM: Magnesium: 2 mg/dL (ref 1.5–2.5)

## 2017-01-07 LAB — VITAMIN D 25 HYDROXY (VIT D DEFICIENCY, FRACTURES): VIT D 25 HYDROXY: 79 ng/mL (ref 30–100)

## 2017-01-07 LAB — TESTOSTERONE: Testosterone: 193 ng/dL — ABNORMAL LOW (ref 250–827)

## 2017-01-14 ENCOUNTER — Other Ambulatory Visit: Payer: Self-pay | Admitting: Internal Medicine

## 2017-01-26 ENCOUNTER — Other Ambulatory Visit: Payer: Self-pay | Admitting: Internal Medicine

## 2017-01-26 DIAGNOSIS — R7989 Other specified abnormal findings of blood chemistry: Secondary | ICD-10-CM

## 2017-01-26 DIAGNOSIS — R5383 Other fatigue: Secondary | ICD-10-CM

## 2017-01-26 DIAGNOSIS — D509 Iron deficiency anemia, unspecified: Secondary | ICD-10-CM

## 2017-03-19 ENCOUNTER — Other Ambulatory Visit: Payer: Self-pay | Admitting: *Deleted

## 2017-03-19 MED ORDER — LEVOTHYROXINE SODIUM 75 MCG PO TABS: 75.0000 ug | ORAL_TABLET | Freq: Every day | ORAL | 1 refills | Status: DC

## 2017-04-02 ENCOUNTER — Encounter: Payer: Self-pay | Admitting: *Deleted

## 2017-07-16 ENCOUNTER — Ambulatory Visit: Payer: Self-pay | Admitting: Physician Assistant

## 2017-07-16 ENCOUNTER — Ambulatory Visit: Payer: Self-pay | Admitting: Internal Medicine

## 2017-08-11 NOTE — Progress Notes (Signed)
Assessment and Plan:  Hypertension:  -Continue medication,  -monitor blood pressure at home.  -Continue DASH diet.   -Reminder to go to the ER if any CP, SOB, nausea, dizziness, severe HA, changes vision/speech, left arm numbness and tingling, and jaw pain.   Hypothyroidism -cont levothyroxine -TSH  Hypogonadism -considering testosterone supplementation -testosterone level  Future Appointments Date Time Provider Department Center  01/29/2018 2:00 PM Lucky Cowboy, MD [facility] None    Continue diet and meds as discussed. Further disposition pending results of labs.  HPI 41 y.o. male  presents for 3 month follow up with hypertension, hyperlipidemia, prediabetes and vitamin D.   His blood pressure has been controlled at home, today their BP is BP: 120/76.   He does workout. He denies chest pain, shortness of breath, dizziness.   He is on cholesterol medication and denies myalgias. His cholesterol is at goal. The cholesterol last visit was:   Lab Results  Component Value Date   CHOL 142 01/06/2017   HDL 41 01/06/2017   LDLCALC 80 01/06/2017   TRIG 104 01/06/2017   CHOLHDL 3.5 01/06/2017   Patient is on Vitamin D supplement.  Lab Results  Component Value Date   VD25OH 79 01/06/2017     He has a history of testosterone deficiency and is on zinc.  Lab Results  Component Value Date   TESTOSTERONE 193 (L) 01/06/2017   He is on thyroid medication. His medication was not changed last visit.   Lab Results  Component Value Date   TSH 1.83 01/06/2017  .    Current Medications:  Current Outpatient Prescriptions on File Prior to Visit  Medication Sig Dispense Refill  . Cholecalciferol (VITAMIN D PO) Take 5,000 Units by mouth 2 (two) times daily.    Marland Kitchen levothyroxine (SYNTHROID, LEVOTHROID) 75 MCG tablet Take 1 tablet (75 mcg total) by mouth daily. 90 tablet 1  . Zinc 25 MG TABS Take 25 mg by mouth daily.     No current facility-administered medications on file prior to  visit.     Medical History:  No past medical history on file.  Allergies:  Allergies  Allergen Reactions  . Penicillins      Review of Systems:  Review of Systems  Constitutional: Negative for chills, fever and malaise/fatigue.  HENT: Negative for congestion, ear pain and sore throat.   Eyes: Negative.   Respiratory: Negative for cough, shortness of breath and wheezing.   Cardiovascular: Negative for chest pain, palpitations and leg swelling.  Gastrointestinal: Negative for abdominal pain, blood in stool, constipation, diarrhea, heartburn and melena.  Genitourinary: Negative.   Skin: Negative.   Neurological: Negative for dizziness, sensory change, loss of consciousness and headaches.  Psychiatric/Behavioral: Negative for depression. The patient is not nervous/anxious and does not have insomnia.     Family history- Review and unchanged  Social history- Review and unchanged  Physical Exam: BP 120/76   Pulse 68   Temp (!) 97.5 F (36.4 C)   Ht 5' 11.25" (1.81 m)   Wt 175 lb (79.4 kg)   SpO2 98%   BMI 24.24 kg/m  Wt Readings from Last 3 Encounters:  08/12/17 175 lb (79.4 kg)  01/06/17 182 lb 12.8 oz (82.9 kg)  09/24/16 186 lb (84.4 kg)    General Appearance: Well nourished well developed, in no apparent distress. Eyes: PERRLA, EOMs, conjunctiva no swelling or erythema ENT/Mouth: Ear canals normal without obstruction, swelling, erythma, discharge.  TMs normal bilaterally.  Oropharynx moist, clear, without exudate, or  postoropharyngeal swelling. Neck: Supple, thyroid normal,no cervical adenopathy  Respiratory: Respiratory effort normal, Breath sounds clear A&P without rhonchi, wheeze, or rale.  No retractions, no accessory usage. Cardio: RRR with no MRGs. Brisk peripheral pulses without edema.  Abdomen: Soft, + BS,  Non tender, no guarding, rebound, hernias, masses. Musculoskeletal: Full ROM, 5/5 strength, Normal gait Skin: Warm, dry without rashes, lesions,  ecchymosis.  Neuro: Awake and oriented X 3, Cranial nerves intact. Normal muscle tone, no cerebellar symptoms. Psych: Normal affect, Insight and Judgment appropriate.    Quentin Mulling, PA-C 2:54 PM [facility]

## 2017-08-12 ENCOUNTER — Ambulatory Visit (INDEPENDENT_AMBULATORY_CARE_PROVIDER_SITE_OTHER): Payer: No Typology Code available for payment source | Admitting: Physician Assistant

## 2017-08-12 ENCOUNTER — Encounter: Payer: Self-pay | Admitting: Physician Assistant

## 2017-08-12 VITALS — BP 120/76 | HR 68 | Temp 97.5°F | Ht 71.25 in | Wt 175.0 lb

## 2017-08-12 DIAGNOSIS — E782 Mixed hyperlipidemia: Secondary | ICD-10-CM

## 2017-08-12 DIAGNOSIS — I10 Essential (primary) hypertension: Secondary | ICD-10-CM | POA: Diagnosis not present

## 2017-08-12 DIAGNOSIS — E039 Hypothyroidism, unspecified: Secondary | ICD-10-CM

## 2017-08-12 DIAGNOSIS — E349 Endocrine disorder, unspecified: Secondary | ICD-10-CM | POA: Diagnosis not present

## 2017-08-12 MED ORDER — LEVOTHYROXINE SODIUM 75 MCG PO TABS: 75.0000 ug | ORAL_TABLET | Freq: Every day | ORAL | 3 refills | Status: DC

## 2017-08-12 NOTE — Patient Instructions (Signed)
9 Ways to Naturally Increase Testosterone Levels  1.   Lose Weight If you're overweight, shedding the excess pounds may increase your testosterone levels, according to research presented at the Endocrine Society's 2012 meeting. Overweight men are more likely to have low testosterone levels to begin with, so this is an important trick to increase your body's testosterone production when you need it most.  2.   High-Intensity Exercise like Peak Fitness  Short intense exercise has a proven positive effect on increasing testosterone levels and preventing its decline. That's unlike aerobics or prolonged moderate exercise, which have shown to have negative or no effect on testosterone levels. Having a whey protein meal after exercise can further enhance the satiety/testosterone-boosting impact (hunger hormones cause the opposite effect on your testosterone and libido). Here's a summary of what a typical high-intensity Peak Fitness routine might look like: " Warm up for three minutes  " Exercise as hard and fast as you can for 30 seconds. You should feel like you couldn't possibly go on another few seconds  " Recover at a slow to moderate pace for 90 seconds  " Repeat the high intensity exercise and recovery 7 more times .  3.   Consume Plenty of Zinc The mineral zinc is important for testosterone production, and supplementing your diet for as little as six weeks has been shown to cause a marked improvement in testosterone among men with low levels.1 Likewise, research has shown that restricting dietary sources of zinc leads to a significant decrease in testosterone, while zinc supplementation increases it2 -- and even protects men from exercised-induced reductions in testosterone levels.3 It's estimated that up to 45 percent of adults over the age of 60 may have lower than recommended zinc intakes; even when dietary supplements were added in, an estimated 20-25 percent of older adults still had inadequate  zinc intakes, according to a National Health and Nutrition Examination Survey.4 Your diet is the best source of zinc; along with protein-rich foods like meats and fish, other good dietary sources of zinc include raw milk, raw cheese, beans, and yogurt or kefir made from raw milk. It can be difficult to obtain enough dietary zinc if you're a vegetarian, and also for meat-eaters as well, largely because of conventional farming methods that rely heavily on chemical fertilizers and pesticides. These chemicals deplete the soil of nutrients ... nutrients like zinc that must be absorbed by plants in order to be passed on to you. In many cases, you may further deplete the nutrients in your food by the way you prepare it. For most food, cooking it will drastically reduce its levels of nutrients like zinc . particularly over-cooking, which many people do. If you decide to use a zinc supplement, stick to a dosage of less than 40 mg a day, as this is the recommended adult upper limit. Taking too much zinc can interfere with your body's ability to absorb other minerals, especially copper, and may cause nausea as a side effect.  4.   Strength Training In addition to Peak Fitness, strength training is also known to boost testosterone levels, provided you are doing so intensely enough. When strength training to boost testosterone, you'll want to increase the weight and lower your number of reps, and then focus on exercises that work a large number of muscles, such as dead lifts or squats.  You can "turbo-charge" your weight training by going slower. By slowing down your movement, you're actually turning it into a high-intensity exercise. Super Slow   movement allows your muscle, at the microscopic level, to access the maximum number of cross-bridges between the protein filaments that produce movement in the muscle.   5.   Optimize Your Vitamin D Levels Vitamin D, a steroid hormone, is essential for the healthy development  of the nucleus of the sperm cell, and helps maintain semen quality and sperm count. Vitamin D also increases levels of testosterone, which may boost libido. In one study, overweight men who were given vitamin D supplements had a significant increase in testosterone levels after one year.5   6.   Reduce Stress When you're under a lot of stress, your body releases high levels of the stress hormone cortisol. This hormone actually blocks the effects of testosterone,6 presumably because, from a biological standpoint, testosterone-associated behaviors (mating, competing, aggression) may have lowered your chances of survival in an emergency (hence, the "fight or flight" response is dominant, courtesy of cortisol).  7.   Limit or Eliminate Sugar from Your Diet Testosterone levels decrease after you eat sugar, which is likely because the sugar leads to a high insulin level, another factor leading to low testosterone.7 Based on USDA estimates, the average American consumes 12 teaspoons of sugar a day, which equates to about TWO TONS of sugar during a lifetime.  8.   Eat Healthy Fats By healthy, this means not only mon- and polyunsaturated fats, like that found in avocadoes and nuts, but also saturated, as these are essential for building testosterone. Research shows that a diet with less than 40 percent of energy as fat (and that mainly from animal sources, i.e. saturated) lead to a decrease in testosterone levels.8 My personal diet is about 60-70 percent healthy fat, and other experts agree that the ideal diet includes somewhere between 50-70 percent fat.  It's important to understand that your body requires saturated fats from animal and vegetable sources (such as meat, dairy, certain oils, and tropical plants like coconut) for optimal functioning, and if you neglect this important food group in favor of sugar, grains and other starchy carbs, your health and weight are almost guaranteed to suffer. Examples of  healthy fats you can eat more of to give your testosterone levels a boost include: Olives and Olive oil  Coconuts and coconut oil Butter made from raw grass-fed organic milk Raw nuts, such as, almonds or pecans Organic pastured egg yolks Avocados Grass-fed meats Palm oil Unheated organic nut oils   9.   Boost Your Intake of Branch Chain Amino Acids (BCAA) from Foods Like Whey Protein Research suggests that BCAAs result in higher testosterone levels, particularly when taken along with resistance training.9 While BCAAs are available in supplement form, you'll find the highest concentrations of BCAAs like leucine in dairy products - especially quality cheeses and whey protein. Even when getting leucine from your natural food supply, it's often wasted or used as a building block instead of an anabolic agent. So to create the correct anabolic environment, you need to boost leucine consumption way beyond mere maintenance levels. That said, keep in mind that using leucine as a free form amino acid can be highly counterproductive as when free form amino acids are artificially administrated, they rapidly enter your circulation while disrupting insulin function, and impairing your body's glycemic control. Food-based leucine is really the ideal form that can benefit your muscles without side effects.    Vitamin D goal is between 60-80  Please make sure that you are taking your Vitamin D as directed.   It is  very important as a natural anti-inflammatory   helping hair, skin, and nails, as well as reducing stroke and heart attack risk.   It helps your bones and helps with mood.  We want you on at least 5000 IU daily  It also decreases numerous cancer risks so please take it as directed.   Low Vit D is associated with a 200-300% higher risk for CANCER   and 200-300% higher risk for HEART   ATTACK  &  STROKE.    .....................................Marland Kitchen  It is also associated with higher death rate at  younger ages,   autoimmune diseases like Rheumatoid arthritis, Lupus, Multiple Sclerosis.     Also many other serious conditions, like depression, Alzheimer's  Dementia, infertility, muscle aches, fatigue, fibromyalgia - just to name a few.  +++++++++++++++++++  Can get liquid vitamin D from [place]  OR here in [place] at  [facility] alternatives [address] [place], [place] [address] Or you can try earth fare

## 2017-08-13 LAB — CBC WITH DIFFERENTIAL/PLATELET
Basophils Absolute: 54 cells/uL (ref 0–200)
Basophils Relative: 0.7 %
Eosinophils Absolute: 177 cells/uL (ref 15–500)
Eosinophils Relative: 2.3 %
HEMATOCRIT: 41.7 % (ref 38.5–50.0)
Hemoglobin: 14.2 g/dL (ref 13.2–17.1)
LYMPHS ABS: 1540 {cells}/uL (ref 850–3900)
MCH: 30.3 pg (ref 27.0–33.0)
MCHC: 34.1 g/dL (ref 32.0–36.0)
MCV: 88.9 fL (ref 80.0–100.0)
MPV: 10.9 fL (ref 7.5–12.5)
Monocytes Relative: 8.3 %
Neutro Abs: 5290 cells/uL (ref 1500–7800)
Neutrophils Relative %: 68.7 %
Platelets: 222 10*3/uL (ref 140–400)
RBC: 4.69 10*6/uL (ref 4.20–5.80)
RDW: 12.6 % (ref 11.0–15.0)
Total Lymphocyte: 20 %
WBC: 7.7 10*3/uL (ref 3.8–10.8)
WBCMIX: 639 {cells}/uL (ref 200–950)

## 2017-08-13 LAB — HEPATIC FUNCTION PANEL
AG RATIO: 1.5 (calc) (ref 1.0–2.5)
ALBUMIN MSPROF: 4.1 g/dL (ref 3.6–5.1)
ALT: 24 U/L (ref 9–46)
AST: 25 U/L (ref 10–40)
Alkaline phosphatase (APISO): 71 U/L (ref 40–115)
BILIRUBIN TOTAL: 0.3 mg/dL (ref 0.2–1.2)
Bilirubin, Direct: 0.1 mg/dL (ref 0.0–0.2)
GLOBULIN: 2.7 g/dL (ref 1.9–3.7)
Indirect Bilirubin: 0.2 mg/dL (calc) (ref 0.2–1.2)
Total Protein: 6.8 g/dL (ref 6.1–8.1)

## 2017-08-13 LAB — BASIC METABOLIC PANEL WITH GFR
BUN: 15 mg/dL (ref 7–25)
CALCIUM: 9.1 mg/dL (ref 8.6–10.3)
CO2: 28 mmol/L (ref 20–32)
CREATININE: 0.98 mg/dL (ref 0.60–1.35)
Chloride: 104 mmol/L (ref 98–110)
GFR, EST NON AFRICAN AMERICAN: 96 mL/min/{1.73_m2} (ref >=60)
GFR, Est African American: 111 mL/min/{1.73_m2} (ref >=60)
Glucose, Bld: 92 mg/dL (ref 65–99)
Potassium: 4.1 mmol/L (ref 3.5–5.3)
Sodium: 139 mmol/L (ref 135–146)

## 2017-08-13 LAB — TSH: TSH: 1.03 mIU/L (ref 0.40–4.50)

## 2017-08-13 LAB — TESTOSTERONE: Testosterone: 186 ng/dL — ABNORMAL LOW (ref 250–827)

## 2017-11-18 HISTORY — PX: OTHER SURGICAL HISTORY: SHX169

## 2017-11-28 ENCOUNTER — Ambulatory Visit (INDEPENDENT_AMBULATORY_CARE_PROVIDER_SITE_OTHER): Payer: No Typology Code available for payment source | Admitting: Family Medicine

## 2017-11-28 ENCOUNTER — Ambulatory Visit (INDEPENDENT_AMBULATORY_CARE_PROVIDER_SITE_OTHER): Payer: No Typology Code available for payment source

## 2017-11-28 ENCOUNTER — Encounter: Payer: Self-pay | Admitting: Family Medicine

## 2017-11-28 VITALS — BP 128/74 | HR 66 | Temp 97.7°F | Ht 71.0 in | Wt 181.8 lb

## 2017-11-28 DIAGNOSIS — R300 Dysuria: Secondary | ICD-10-CM | POA: Diagnosis not present

## 2017-11-28 DIAGNOSIS — M79672 Pain in left foot: Secondary | ICD-10-CM | POA: Diagnosis not present

## 2017-11-28 DIAGNOSIS — R3915 Urgency of urination: Secondary | ICD-10-CM | POA: Diagnosis not present

## 2017-11-28 LAB — POCT URINALYSIS DIPSTICK
BILIRUBIN UA: NEGATIVE
Blood, UA: NEGATIVE
GLUCOSE UA: NEGATIVE
Ketones, UA: NEGATIVE
LEUKOCYTES UA: NEGATIVE
Nitrite, UA: NEGATIVE
Protein, UA: NEGATIVE
Spec Grav, UA: 1.015 (ref 1.010–1.025)
Urobilinogen, UA: 0.2 E.U./dL
pH, UA: 6 (ref 5.0–8.0)

## 2017-11-28 MED ORDER — PHENAZOPYRIDINE HCL 200 MG PO TABS: 200.0000 mg | ORAL_TABLET | Freq: Three times a day (TID) | ORAL | 0 refills | Status: DC | PRN

## 2017-11-28 NOTE — Patient Instructions (Signed)
Start the Pyridium.  You likely have urethral irritation from your recent kidney stone.  We will check a urine culture to make sure he did not have urinary tract infection.  We will check an x-ray of your foot today.  I think you likely have some ligamentous injury.  Please come back to see Dr. Berline Choughigby soon for further evaluation.  Please come back yearly for your annual physical exam.  Take care, Dr. Jimmey RalphParker

## 2017-11-28 NOTE — Progress Notes (Signed)
Subjective:  Ryan Lam is a 42 y.o. male who presents today with a chief complaint of urinary urgency and to establish care.   HPI:  Patient has lived in [place] his entire life.  Currently works in Clinical research associategraphics design and marketing.  Urinary urgency, new issue Symptoms started about a week ago.  Patient has a history of recurrent kidney stones and passed a kidney stone around this time.  He thought it was normal to have increased urgency and a "pressure sensation" while passing the kidney stone.  He expected this to last for a couple days however became concerned when it did not go away.  Associated symptoms include increased frequency as well as some mild discomfort along the mid to distal portion of his urethra while urinating.  No testicular or scrotal pain.  No perineal pain.  No penile discharge.  No hematuria.  Left foot pain, new issue Symptoms started about 9 months ago.  At that time patient stepped in a hole with his left foot and noticed immediate pain.  He was on crutches for a period of time however the pain gradually improved.  He never saw physician for this.  Over the last several months he has had persistent pain mostly located along the anterior aspect of his foot.  Pain occurs randomly.  Worse in the mornings.  He has not noticed anything else that makes the pain better or worse.  Patient reports that he is prone to having ankle sprains.  ROS: Per HPI, otherwise a 10 point review of systems was performed and was negative  PMH:  The following were reviewed and entered/updated in epic: History reviewed. No pertinent past medical history. Patient Active Problem List   Diagnosis Date Noted  . Abnormal CBC 01/26/2017  . Iron deficiency anemia, rule/out  01/26/2017  . Benign labile hypertension 12/11/2015  . Hypothyroidism 09/29/2014  . Testosterone deficiency 09/29/2014  . Mixed hyperlipidemia 09/29/2014  . Vitamin D deficiency 09/29/2014  . Medication management  09/29/2014  . Kidney stones 10/27/2013   Past Surgical History:  Procedure Laterality Date  . LASIK  2008    Family History  Problem Relation Age of Onset  . Cancer Mother        hx breast  . COPD Father   . AAA (abdominal aortic aneurysm) Father     Medications- reviewed and updated Current Outpatient Medications  Medication Sig Dispense Refill  . Cholecalciferol ([facility] VITAMIN D) 2000 units CAPS Take by mouth.    . levothyroxine (SYNTHROID, LEVOTHROID) 75 MCG tablet Take 1 tablet (75 mcg total) by mouth daily. 90 tablet 3  . Nutritional Supplements (JUICE PLUS FIBRE PO) Take by mouth.    . Zinc 25 MG TABS Take 25 mg by mouth daily.     No current facility-administered medications for this visit.    Allergies-reviewed and updated Allergies  Allergen Reactions  . Penicillins    Social History   Socioeconomic History  . Marital status: Married    Spouse name: None  . Number of children: None  . Years of education: None  . Highest education level: None  Social Needs  . Financial resource strain: None  . Food insecurity - worry: None  . Food insecurity - inability: None  . Transportation needs - medical: None  . Transportation needs - non-medical: None  Occupational History  . None  Tobacco Use  . Smoking status: Never Smoker  . Smokeless tobacco: Never Used  Substance and Sexual Activity  .  Alcohol use: Yes    Alcohol/week: 6.0 oz    Types: 10 Standard drinks or equivalent per week  . Drug use: No  . Sexual activity: No  Other Topics Concern  . None  Social History Narrative  . None    Objective:  Physical Exam: BP 128/74 (BP Location: Left Arm, Patient Position: Sitting, Cuff Size: Normal)   Pulse 66   Temp 97.7 F (36.5 C) (Oral)   Ht 5\' 11"  (1.803 m)   Wt 181 lb 12.8 oz (82.5 kg)   SpO2 96%   BMI 25.36 kg/m   Gen: NAD, resting comfortably CV: RRR with no murmurs appreciated Pulm: NWOB, CTAB with no crackles, wheezes, or rhonchi GI: Normal  bowel sounds present. Soft, Nontender, Nondistended. MSK:  -Left foot: No deformities.  Tender to palpation along midfoot.  No malleolus tenderness.  Ligamentous laxity noted with anterior drawer sign as well as talar tilt test.  He has some tenderness elicited to the anterior lateral aspect of his foot with the talar tilt test.  Neurovascularly intact distally. Skin: Warm, dry Neuro: Grossly normal, moves all extremities Psych: Normal affect and thought content  Results for orders placed or performed in visit on 11/28/17 (from the past 24 hour(s))  POCT urinalysis dipstick     Status: Normal   Collection Time: 11/28/17  2:13 PM  Result Value Ref Range   Color, UA Yellow    Clarity, UA Clear    Glucose, UA Negative    Bilirubin, UA Negative    Ketones, UA Negative    Spec Grav, UA 1.015 1.010 - 1.025   Blood, UA Negative    pH, UA 6.0 5.0 - 8.0   Protein, UA Negative    Urobilinogen, UA 0.2 0.2 or 1.0 E.U./dL   Nitrite, UA Negative    Leukocytes, UA Negative Negative   Appearance     Odor     Assessment/Plan:  Urinary urgency UA today negative.  Likely secondary to urethral irritation from recent passage of kidney stone.  He will start Pyridium for the next few days.  We will also check a urine culture to definitively rule out UTI.  If symptoms continue to persist, will need urological evaluation.   Left Foot Pain Patient with mild ligamentous laxity on his exam which is likely contributing to both his history of recurrent ankle sprain as well as his ongoing foot pain.  We will check an x-ray today to rule out any other bony abnormality.  He will be following up with our sports medicine physician within the next couple weeks for further evaluation.  Discussed home exercise program with patient including heel raises and toe raises.  May need formal PT evaluation.  Return precautions reviewed.  [name]. Ryan Ralph, MD 11/28/2017 2:25 PM

## 2017-11-29 LAB — URINE CULTURE
MICRO NUMBER:: 90046797
RESULT: NO GROWTH
SPECIMEN QUALITY:: ADEQUATE

## 2017-11-29 NOTE — Progress Notes (Signed)
Patient's x-ray is negative.  Please inform patient.

## 2017-12-01 NOTE — Progress Notes (Signed)
Urine culture negative - patient does not need antibiotics. Please inform patient.

## 2017-12-02 ENCOUNTER — Other Ambulatory Visit: Payer: Self-pay | Admitting: Family Medicine

## 2017-12-03 NOTE — Telephone Encounter (Signed)
Rx sent to pharmacy.  Patient notified via voicemail (identified self) and advised to schedule appointment if symptoms persist.

## 2017-12-03 NOTE — Telephone Encounter (Signed)
Ok for 1 refill. Will need to be seen again if symptoms persist.

## 2017-12-11 ENCOUNTER — Ambulatory Visit: Payer: No Typology Code available for payment source | Admitting: Sports Medicine

## 2017-12-11 NOTE — Progress Notes (Deleted)
  Veverly FellsMichael D. Delorise Shinerigby, DO  [facility] [phone]  Ryan Lam Tu - 42 y.o. male MRN [id number]  Date of birth: 09/26/1976  Visit Date: 12/11/2017  PCP: Ardith DarkParker, Caleb M, MD   Referred by: Ardith DarkParker, Caleb M, MD   Scribe for today's visit: Stevenson ClinchBrandy Coleman, CMA     SUBJECTIVE:  Ryan Lam [name] is here for No chief complaint on file. Marland Kitchen.  Referred by: Dr. Jacquiline Doealeb Parker His LT foot pain symptoms INITIALLY: Began about 9 months ago and began after stepping in a hole, he felt pain immediately.  Described as {mild/mod/severe:3049053} {*Pain/Burning/Swelling}, {Pain radiation-gi:31246} Worse in the mornings.  Improved with {alleviating} Additional associated symptoms include: He reports hx of ankle sprain.     At this time symptoms are worsening compared to onset  He had been ambulating with crutches and pain did improve over time but has flared up again.    ROS {ACTIONS;DENIES/REPORTS:21021675::"Denies"} night time disturbances. {ACTIONS;DENIES/REPORTS:21021675::"Denies"} fevers, chills, or night sweats. {ACTIONS;DENIES/REPORTS:21021675::"Denies"} unexplained weight loss. {ACTIONS;DENIES/REPORTS:21021675::"Denies"} personal history of cancer. {ACTIONS;DENIES/REPORTS:21021675::"Denies"} changes in bowel or bladder habits. {ACTIONS;DENIES/REPORTS:21021675::"Denies"} recent unreported falls. {ACTIONS;DENIES/REPORTS:21021675::"Denies"} new or worsening dyspnea or wheezing. {ACTIONS;DENIES/REPORTS:21021675::"Denies"} headaches or dizziness.  {ACTIONS;DENIES/REPORTS:21021675::"Denies"} numbness, tingling or weakness  In the extremities.  {ACTIONS;DENIES/REPORTS:21021675::"Denies"} dizziness or presyncopal episodes {ACTIONS;DENIES/REPORTS:21021675::"Denies"} lower extremity edema    { }

## 2017-12-19 ENCOUNTER — Encounter: Payer: Self-pay | Admitting: Sports Medicine

## 2017-12-30 ENCOUNTER — Other Ambulatory Visit: Payer: Self-pay | Admitting: Physician Assistant

## 2017-12-30 MED ORDER — OSELTAMIVIR PHOSPHATE 75 MG PO CAPS: 75.0000 mg | ORAL_CAPSULE | Freq: Every day | ORAL | 0 refills | Status: DC

## 2017-12-30 NOTE — Progress Notes (Signed)
Patient's wife is present today for flu-like symptoms. Patient's wife tested positive today for influenza A. Patient is requesting prophylactic Tamiflu. I have sent in 75 mg Tamiflu daily for 7 days to his pharmacy. If symptoms worsen or has other concerns arise, patient is to make an appointment with our office. Patient verbalized understanding.  Jarold MottoSamantha Huong Luthi PA-C

## 2018-01-09 ENCOUNTER — Ambulatory Visit (INDEPENDENT_AMBULATORY_CARE_PROVIDER_SITE_OTHER): Payer: No Typology Code available for payment source | Admitting: Family Medicine

## 2018-01-09 ENCOUNTER — Encounter: Payer: Self-pay | Admitting: Family Medicine

## 2018-01-09 VITALS — BP 132/78 | HR 65 | Temp 97.9°F | Ht 71.0 in | Wt 184.2 lb

## 2018-01-09 DIAGNOSIS — M25562 Pain in left knee: Secondary | ICD-10-CM | POA: Diagnosis not present

## 2018-01-09 DIAGNOSIS — M79672 Pain in left foot: Secondary | ICD-10-CM

## 2018-01-09 MED ORDER — DICLOFENAC SODIUM 75 MG PO TBEC: 75.0000 mg | DELAYED_RELEASE_TABLET | Freq: Two times a day (BID) | ORAL | 0 refills | Status: DC

## 2018-01-09 NOTE — Progress Notes (Signed)
    Subjective:  Ryan Lam is a 42 y.o. male who presents today for same-day appointment with a chief complaint of knee pain.   HPI:  Knee Pain, Acute Issue Started 3 weeks ago. Located along the anterior aspect of left knee.  Trauma, falls, or other obvious precipitating events.  Worse with movement.  Also worse with direct pressure on his knee.  Has not tried any over-the-counter medications.  No locking, popping, catching or other mechanical symptoms.  No swelling.  No fevers or chills.  No other obvious alleviating or aggravating factors.  ROS: Per HPI  PMH: He reports that  has never smoked. he has never used smokeless tobacco. He reports that he drinks about 6.0 oz of alcohol per week. He reports that he does not use drugs.  Objective:  Physical Exam: BP 132/78 (BP Location: Left Arm, Patient Position: Sitting, Cuff Size: Normal)   Pulse 65   Temp 97.9 F (36.6 C) (Oral)   Ht 5\' 11"  (1.803 m)   Wt 184 lb 3.2 oz (83.6 kg)   SpO2 98%   BMI 25.69 kg/m   Gen: NAD, resting comfortably -Left knee: No deformities.  Tender to palpation along inferior pole of strength 5 out of 5 in all directions.  Stable to varus and valgus stress.  Anterior and posterior drawer signs negative.  McMurray negative.  Lockman negative.  Assessment/Plan:  Left knee pain Exam consistent with patellar tendinitis.  Start diclofenac 75 mg twice daily for the next 2 weeks.  Also start home exercise program focused on strengthening quad muscle group.  Encouraged ice 3-4 times daily over the next couple of weeks.  He will be following up with Dr. Berline Choughigby next week.  Discussed reasons to return to care earlier.  [name] RalphParker, MD 01/09/2018 4:28 PM

## 2018-01-09 NOTE — Addendum Note (Signed)
Addended by: Ardith DarkPARKER, CALEB M on: 01/09/2018 04:46 PM   Modules accepted: Orders

## 2018-01-14 ENCOUNTER — Encounter: Payer: Self-pay | Admitting: Sports Medicine

## 2018-01-14 ENCOUNTER — Ambulatory Visit (INDEPENDENT_AMBULATORY_CARE_PROVIDER_SITE_OTHER): Payer: No Typology Code available for payment source | Admitting: Sports Medicine

## 2018-01-14 ENCOUNTER — Ambulatory Visit: Payer: Self-pay

## 2018-01-14 VITALS — BP 126/96 | HR 70 | Ht 71.0 in | Wt 184.4 lb

## 2018-01-14 DIAGNOSIS — M7652 Patellar tendinitis, left knee: Secondary | ICD-10-CM

## 2018-01-14 DIAGNOSIS — M25562 Pain in left knee: Secondary | ICD-10-CM | POA: Diagnosis not present

## 2018-01-14 DIAGNOSIS — M25872 Other specified joint disorders, left ankle and foot: Secondary | ICD-10-CM

## 2018-01-14 DIAGNOSIS — M79672 Pain in left foot: Secondary | ICD-10-CM

## 2018-01-14 MED ORDER — DICLOFENAC SODIUM 2 % TD SOLN: 1.0000 "application " | Freq: Two times a day (BID) | TRANSDERMAL | 2 refills | Status: DC

## 2018-01-14 MED ORDER — DICLOFENAC SODIUM 2 % TD SOLN: 1.0000 "application " | Freq: Two times a day (BID) | TRANSDERMAL | 0 refills | Status: AC

## 2018-01-14 NOTE — Patient Instructions (Signed)
Please perform the exercise program that we have prepared for you and gone over in detail on a daily basis.  In addition to the handout you were provided you can access your program through: [email or web address]   Your unique program code is: Administrator, Civil [email or web address]   [facility] instructions for Duexis, Pennsaid and Vimovo:  Your prescription will be filled through a mail order pharmacy.  It is typically [facility] but may vary depending on where you live.  You will receive a phone call from them which will typically come from a [phone] phone number.  You must speak directly to them to have this medication filled.  When the pharmacy calls, they will need your mailing address (for overnight shipment of the medication) andy they will need payment information if you have a copay (typically no more than $10). If you have not heard from them 2-3 days after your appointment with Dr. Berline Choughigby, contact us at the office [phone]) or through MyChart so we can reach back out to the pharmacy.

## 2018-01-14 NOTE — Progress Notes (Signed)
Ryan Lam. [facility] [phone]  Ryan Lam - 42 y.o. male MRN [id number]  Date of birth: 19-Oct-1976  Visit Date: 01/14/2018  PCP: Ardith Dark, MD   Referred by: Ardith Dark, MD   Scribe for today's visit: Christoper Fabian, LAT, ATC     SUBJECTIVE:  Ryan Lam is here for New Patient (Initial Visit) (L knee pain) .  Referred by: Dr. [name] His L ant knee pain symptoms INITIALLY: Began about a month ago w/ no MOI Described as an 8/10 pain w/ pressure on his L ant. Knee and has no pain at rest, nonradiating Worsened with pressure to the front of his knee Improved with getting rid of pressure on his knee Additional associated symptoms include: no swelling and no popping/clicking  His L ankle pain symptoms began on Mother's Day of 2018 when he stepped in a hole in his yard.  Was on crutches x a few days.  Used RICE initially.  Will still have random pains in his L, lateral ankle.  Not preventing him from doing anything.  His L shoulder pain (AC joint area) began a couple months ago w/ no MOI.  States that he first noticed it when he sat up in bed and rested his weight on his L elbow which caused pain in the front of his L shoulder.    At this time symptoms show no change compared to onset  He has been doing home exercises to strengthen his quads.   ROS Denies night time disturbances. Denies fevers, chills, or night sweats. Denies unexplained weight loss. Denies personal history of cancer. Denies changes in bowel or bladder habits. Denies recent unreported falls. Denies new or worsening dyspnea or wheezing. Reports headaches or dizziness. Headaches - yes. Denies numbness, tingling or weakness  In the extremities.  Denies dizziness or presyncopal episodes Denies lower extremity edema     HISTORY & PERTINENT PRIOR DATA:  Prior History reviewed and updated per electronic medical record.    Significant/pertinent history, findings, studies include:  reports that he has never smoked. He has never used smokeless tobacco. No results for input(s): HGBA1C, LABURIC, CREATINE in the last 8760 hours. No specialty comments available. No problems updated.  OBJECTIVE:  VS:  HT:5\' 11"  (180.3 cm)   WT:184 lb 6.4 oz (83.6 kg)  BMI:25.73    BP:(Abnormal) 126/96  HR:70bpm  TEMP: ( )  RESP:96 %   PHYSICAL EXAM: Constitutional: WDWN, Non-toxic appearing. Psychiatric: Alert & appropriately interactive.  Not depressed or anxious appearing. Respiratory: No increased work of breathing.  Trachea Midline Eyes: Pupils are equal.  EOM intact without nystagmus.  No scleral icterus  Vascular Exam: warm to touch no edema  upper and lower extremity neuro exam: unremarkable normal strength normal sensation normal reflexes  MSK Exam: Left knee is overall well aligned.  He has focal TTP over the inferior portion of the patella at the inferior patellar pole.  There is no significant crepitation.  He is ligamentously stable.  His left ankle has pain within the anterior lateral aspect consistent with ankle impingement.  Ligamentously stable.  DP PT pulses are 2+/4.   ASSESSMENT & PLAN:   1. Acute pain of left knee   2. Left foot pain   3. Patellar tendinitis of left knee   4. Ankle impingement syndrome, left     PLAN: symptoms are consistent with patellar tendinitis and left ankle impingement.  Over-the-counter  cushioned insoles discussed as well as therapeutic exercises.  We will begin him on anti-inflammatories and plan to follow-up with him in 6 weeks to check on clinical improvement.    Follow-up: Return in about 6 weeks (around 02/25/2018) for repeat clinical exam.    PROCEDURE NOTE: THERAPEUTIC EXERCISES (97110) 15 minutes spent for Therapeutic exercises as below and as referenced in the AVS.  This included exercises focusing on stretching, strengthening, with significant focus on  eccentric aspects.   Proper technique shown and discussed handout in great detail with ATC.  All questions were discussed and answered.   Long term goals include an improvement in range of motion, strength, endurance as well as avoiding reinjury. Frequency of visits is one time as determined during today's  office visit. Frequency of exercises to be performed is as per handout.  EXERCISES REVIEWED:  Hip ABduction strengthening with focus on Glute Medius Recruitment  VMO Strengthening  4 Way Ankle exercises    Please see additional documentation for Objective, Assessment and Plan sections. Pertinent additional documentation may be included in corresponding procedure notes, imaging studies, problem based documentation and patient instructions. Please see these sections of the encounter for additional information regarding this visit.  CMA/ATC served as Neurosurgeonscribe during this visit. History, Physical, and Plan performed by medical provider. Documentation and orders reviewed and attested to.      Andrena MewsMichael D Rigby, DO    [facility] Physician

## 2018-01-14 NOTE — Procedures (Signed)
LIMITED MSK ULTRASOUND OF LEFT ANTERIOR KNEE Images were obtained and interpreted by myself, Gaspar BiddingMichael Lazette Estala, DO  Images have been saved and stored to PACS system. Images obtained on: GE S7 Ultrasound machine  FINDINGS:   Patellar tendon is overall intact with a small amount of hypoechoic change within the deep portion of proximal fibers.  IMPRESSION:  1. Findings are consistent with patellar tendinosis/tendinitis

## 2018-01-14 NOTE — Procedures (Signed)

## 2018-01-15 ENCOUNTER — Encounter: Payer: Self-pay | Admitting: Sports Medicine

## 2018-01-19 ENCOUNTER — Telehealth: Payer: Self-pay | Admitting: Family Medicine

## 2018-01-19 NOTE — Telephone Encounter (Signed)
Copied from CRM [id number]. Topic: Quick Communication - See Telephone Encounter >> Jan 19, 2018  3:56 PM Oneal GroutSebastian, Jennifer S wrote: CRM for notification. See Telephone encounter for: Pharmacy is unable to fill, Diclofenac Sodium (PENNSAID) 2 % SOLN. Can a voltren gel be called in? Please advise. Can be sent to patients local pharmacy.  01/19/18.

## 2018-01-20 ENCOUNTER — Other Ambulatory Visit: Payer: Self-pay

## 2018-01-20 ENCOUNTER — Telehealth: Payer: Self-pay | Admitting: Family Medicine

## 2018-01-20 MED ORDER — DICLOFENAC SODIUM 2 % TD SOLN: 1.0000 "application " | Freq: Two times a day (BID) | TRANSDERMAL | 2 refills | Status: DC

## 2018-01-20 NOTE — Telephone Encounter (Signed)
Copied from CRM [id number]. Topic: Quick Communication - Rx Refill/Question >> Jan 20, 2018  1:15 PM Alexander BergeronBarksdale, Harvey B wrote: Medication: Diclofenac Sodium (PENNSAID) 2 % SOLN [[id number]][[id number]]   Pharmacist called and states this medication is not covered under the pt's insurance, alternative needed contact pharmacy to advise

## 2018-01-20 NOTE — Telephone Encounter (Signed)
Can you call [facility] and see why it can't be filled.  Isn't there an alternative mail order [facility] that they typically send these to?  If they're isn't an alternative option then okay to send in Generic Voltaren Gel to pt's local pharmacy

## 2018-01-20 NOTE — Telephone Encounter (Signed)
Spoke with [facility] and [facility] to [facility]. Was [facility] Revonda Standard[facility]ison at [facility] that they did not have pt to their system. Rx was resent directly to [facility] fax# [phone].

## 2018-01-21 ENCOUNTER — Other Ambulatory Visit: Payer: Self-pay

## 2018-01-21 MED ORDER — DICLOFENAC SODIUM 1 % TD GEL: TRANSDERMAL | 1 refills | Status: DC

## 2018-01-21 NOTE — Telephone Encounter (Signed)
Spoke with patient, he advised that pharmacy contacted him and told him Pennsaid would not be covered. New rx for Voltaren sent to [facility]. Advised pt to contact pharmacy by phone prior to picking up rx just in case his insurance requires prior authorization.

## 2018-01-21 NOTE — Telephone Encounter (Signed)
See note

## 2018-01-21 NOTE — Telephone Encounter (Signed)
Pt is requesting med ( Diclofenac Sodium 2% SOLN) that is not covered by his insurance. Please review.  PCP   C. Hal NeerParker  [facility] [phone]

## 2018-01-22 NOTE — Telephone Encounter (Signed)
Please advise 

## 2018-01-22 NOTE — Telephone Encounter (Signed)
Med prescribed by Dr Berline Choughigby. Please send to his pool.  [name] RalphParker, MD 01/22/2018 5:01 PM

## 2018-01-23 NOTE — Telephone Encounter (Signed)
May want to try goodrx savings card, medication is around $20 with savings card. Called pt and left VM to call the office.

## 2018-01-26 NOTE — Telephone Encounter (Signed)
Called and left VM for pt to call the office.  

## 2018-01-29 ENCOUNTER — Encounter: Payer: Self-pay | Admitting: Internal Medicine

## 2018-02-25 ENCOUNTER — Ambulatory Visit: Payer: No Typology Code available for payment source | Admitting: Sports Medicine

## 2018-06-02 ENCOUNTER — Ambulatory Visit: Payer: No Typology Code available for payment source | Admitting: Family Medicine

## 2018-06-02 ENCOUNTER — Encounter: Payer: Self-pay | Admitting: Family Medicine

## 2018-06-02 VITALS — BP 122/82 | HR 75 | Temp 98.8°F | Ht 71.0 in | Wt 183.0 lb

## 2018-06-02 DIAGNOSIS — E349 Endocrine disorder, unspecified: Secondary | ICD-10-CM

## 2018-06-02 DIAGNOSIS — E039 Hypothyroidism, unspecified: Secondary | ICD-10-CM | POA: Diagnosis not present

## 2018-06-02 DIAGNOSIS — J329 Chronic sinusitis, unspecified: Secondary | ICD-10-CM | POA: Diagnosis not present

## 2018-06-02 MED ORDER — DOXYCYCLINE HYCLATE 100 MG PO TABS: 100.0000 mg | ORAL_TABLET | Freq: Two times a day (BID) | ORAL | 0 refills | Status: DC

## 2018-06-02 NOTE — Assessment & Plan Note (Signed)
Symptoms are stable.  Continue Synthroid 75 mcg daily.  Check TSH with next blood draw.

## 2018-06-02 NOTE — Patient Instructions (Signed)
Start the doxycycline if your symptoms worsen or do not improve in a few days.  Please stay well hydrated.  You can take tylenol and/or motrin as needed for low grade fever and pain.  Please let me know if your symptoms worsen or fail to improve.  Take care, Dr [name]   Testosterone Replacement Therapy Testosterone replacement therapy (TRT) is used to treat men who have a low testosterone level (hypogonadism). Testosterone is a male hormone that is produced in the testicles. It is responsible for typically male characteristics and for maintaining a man's sex drive and the ability to get an erection. Testosterone also supports bone and muscle health. TRT can be a gel, liquid, or patch that you put on your skin. It can also be in the form of a tablet or an injection. In some cases, your health care provider may insert long-acting pellets under your skin. In most men, the level of testosterone starts to decline gradually after age 2. Low testosterone can also be caused by certain medical conditions, medicines, and obesity. Your health care provider can diagnose hypogonadism with at least two blood tests that are done early in the morning. Low testosterone may not need to be treated. TRT is usually a choice that you make with your health care provider. Your health care provider may recommend TRT if you have low testosterone that is causing symptoms, such as:  Low sex drive.  Erection problems.  Breast enlargement.  Loss of body hair.  Weak muscles or bones.  Shrinking testicles.  Increased body fat.  Low energy.  Hot flashes.  Depression.  Decreased work International aid/development worker.  TRT is a lifetime treatment. If you stop treatment, your testosterone will drop, and your symptoms may return. What are the risks? Testosterone replacement therapy may have side effects, including:  Lower sperm count.  Skin irritation at the application or injection site.  Mouth irritation if you take an oral  tablet.  Acne.  Swelling of your legs or feet.  Tender breasts.  Dizziness.  Sleep disturbance.  Mood swings.  Possible increased risk of stroke or heart attack.  Testosterone replacement therapy may also increase your risk for prostate cancer or male breast cancer. You should not use TRT if you have either of those conditions. Your health care provider also may not recommend TRT if:  You are suspected of having prostate cancer.  You want to father a child.  You have a high number of red blood cells.  You have untreated sleep apnea.  You have a very large prostate.  Supplies needed:  Your health care provider will prescribe the testosterone gel, solution, or medicine that you need. If your health care provider teaches you to do self-injections at home, you will also need: ? Your medicine vial. ? Disposable needles and syringes. ? Alcohol swabs. ? A needle disposal container. ? Adhesive bandages. How to use testosterone replacement therapy Your health care provider will help you find the TRT option that will work best for you based on your preference, the side effects, and the cost. You may:  Rub testosterone gel on your upper arm or shoulder every day after a shower. This is the most common type of TRT. Do not let women or children come in contact with the gel.  Apply a testosterone solution under your arms once each day.  Place a testosterone patch on your skin once each day.  Dissolve a testosterone tablet in your mouth twice each day.  Have a testosterone  pellet inserted under your skin by your health care provider. This will be replaced every 3-6 months.  Use testosterone nasal spray three times each day.  Get testosterone injections. For some types of testosterone, your health care provider will give you this injection. With other types of testosterone, you may be taught to give injections to yourself. The frequency of injections may vary based on the type of  testosterone that you receive.  Follow these instructions at home:  Take over-the-counter and prescription medicines only as told by your health care provider.  Lose weight if you are overweight. Ask your health care provider to help you start a healthy diet and exercise program to reach and maintain a healthy weight.  Work with your health care provider to treat other medical conditions that may lower your testosterone. These include obesity, high blood pressure, high cholesterol, diabetes, liver disease, kidney disease, and sleep apnea.  Keep all follow-up visits as told by your health care provider. This is important. General recommendations  Discuss all risks and benefits with your health care provider before starting therapy.  Work with your health care provider to check your prostate health and do blood testing before you start therapy.  Do not use any testosterone replacement therapies that are not prescribed by your health care provider or not approved for use in the [place]  Do not use TRT for bodybuilding or to improve sexual performance. TRT should be used only to treat symptoms of low testosterone.  Return for all repeat prostate checks and blood tests during therapy, as told by your health care provider. Where to find more information: Learn more about testosterone replacement therapy from:  American Urological Foundation: www.urologyhealth.org/urologic-conditions/low-testosterone-(hypogonadism)  Endocrine Society: www.hormone.org/diseases-and-conditions/mens-health/hypogonadism  Contact a health care provider if:  You have side effects from your testosterone replacement therapy.  You continue to have symptoms of low testosterone during treatment.  You develop new symptoms during treatment. Summary  Testosterone replacement therapy is only for men who have low testosterone as determined by blood testing and who have symptoms of low testosterone.  Testosterone  replacement therapy should be prescribed only by a health care provider and should be used under the supervision of a health care provider.  You may not be able to take testosterone if you have certain medical conditions, including prostate cancer, male breast cancer, or heart disease.  Testosterone replacement therapy may have side effects and may make some medical conditions worse.  Talk with your health care provider about all the risks and benefits before you start therapy. This information is not intended to replace advice given to you by your health care provider. Make sure you discuss any questions you have with your health care provider. Document Released: 07/25/2016 Document Revised: 07/25/2016 Document Reviewed: 07/25/2016 Elsevier Interactive Patient Education  2018 [employer]

## 2018-06-02 NOTE — Assessment & Plan Note (Signed)
Briefly discussed risks and benefits of testosterone placement.  He was low based on his blood work about a year ago.  We will recheck early morning testosterone level.

## 2018-06-02 NOTE — Progress Notes (Addendum)
   Subjective:  Ryan Lam is a 42 y.o. male who presents today for same-day appointment with a chief complaint of sinus pressure.   HPI:  Sinus pressure, Acute problem Symptoms started yesterday. Had a fever yesterday to 102F. Took OTC fever reducers which helped some. No rhinorrhea or sore throat. No cough. Children have been sick with similar symptoms. No other treatments tired.  No other obvious alleviating or aggravating factors.  Hypo-testosteronism, chronic problem, stable Several year history.  Was prescribed gel in the past however was not able to afford.  Hypothyroidism, chronic problem, stable Currently on Synthroid 75 mcg daily.  Tolerates well without reported side effects.  ROS: Per HPI  PMH: He reports that he has never smoked. He has never used smokeless tobacco. He reports that he drinks about 6.0 oz of alcohol per week. He reports that he does not use drugs.  Objective:  Physical Exam: BP 122/82 (BP Location: Left Arm, Patient Position: Sitting, Cuff Size: Normal)   Pulse 75   Temp 98.8 F (37.1 C) (Oral)   Ht 5\' 11"  (1.803 m)   Wt 183 lb (83 kg)   SpO2 98%   BMI 25.52 kg/m   Gen: NAD, resting comfortably HEENT: TMs with clear effusion bilaterally.  Nasal mucosa erythematous and boggy bilaterally.  Oropharynx slightly erythematous without exudate.  Maxillary sinuses with decreased transillumination bilaterally. CV: RRR with no murmurs appreciated Pulm: NWOB, CTAB with no crackles, wheezes, or rhonchi  Assessment/Plan:  Sinusitis Most likely secondary to viral URI.  Does not have a fever today.  No other red flag signs or symptoms.  We will continue with watchful waiting.  Since he has a "pocket prescription" for doxycycline with instruction to not start unless symptoms worsen or do not improve over the next several days.  Recommended other supportive measures including good oral hydration and over-the-counter analgesics as needed.  Discussed reasons to  return to care.  Follow-up as needed.  Testosterone deficiency Briefly discussed risks and benefits of testosterone placement.  He was low based on his blood work about a year ago.  We will recheck early morning testosterone level.  Hypothyroidism Symptoms are stable.  Continue Synthroid 75 mcg daily.  Check TSH with next blood draw.  Ryan Degreealeb M. Ryan RalphParker, MD 06/02/2018 1:50 PM

## 2018-06-03 ENCOUNTER — Other Ambulatory Visit: Payer: No Typology Code available for payment source

## 2018-08-17 ENCOUNTER — Encounter: Payer: Self-pay | Admitting: Physician Assistant

## 2018-08-17 ENCOUNTER — Ambulatory Visit: Payer: No Typology Code available for payment source | Admitting: Physician Assistant

## 2018-08-17 VITALS — BP 136/90 | HR 75 | Temp 98.2°F | Ht 71.0 in | Wt 182.4 lb

## 2018-08-17 DIAGNOSIS — J069 Acute upper respiratory infection, unspecified: Secondary | ICD-10-CM

## 2018-08-17 DIAGNOSIS — R591 Generalized enlarged lymph nodes: Secondary | ICD-10-CM

## 2018-08-17 LAB — POCT RAPID STREP A (OFFICE): Rapid Strep A Screen: NEGATIVE

## 2018-08-17 MED ORDER — DOXYCYCLINE HYCLATE 100 MG PO TABS: 100.0000 mg | ORAL_TABLET | Freq: Two times a day (BID) | ORAL | 0 refills | Status: DC

## 2018-08-17 NOTE — Patient Instructions (Addendum)
It was great to see you!  You have a viral upper respiratory infection. Antibiotics are not needed for this.  Viral infections usually take 7-10 days to resolve.   Push fluids and get plenty of rest. Please return if you are not improving as expected, or if you have high fevers (>101.5) or difficulty swallowing or worsening productive cough.  Call clinic with questions.  I hope you start feeling better soon!  If you feel like you need to start the antibiotic, as we discussed, I have sent in Doxcycline for you should you need it.  If your lymph node is still present after two weeks, please follow-up with [name].

## 2018-08-17 NOTE — Progress Notes (Signed)
Ryan Lam is a 42 y.o. male here for a new problem.  I acted as a Neurosurgeon for [employer], PA-C Corky Mull, LPN   History of Present Illness:   Chief Complaint  Patient presents with  . Sore Throat    Sore Throat   This is a new problem. Episode onset: Started last night. The problem has been unchanged. The pain is worse on the right side. There has been no fever. The pain is at a severity of 2/10. The pain is mild. Associated symptoms include coughing and swollen glands (Right side). Pertinent negatives include no headaches, hoarse voice, neck pain, shortness of breath, trouble swallowing or vomiting. Associated symptoms comments: Loose stools last night.. Treatments tried: Pepto. The treatment provided mild relief.   He reports that he has two children at home, one of them was recently diagnosed with ear infection and conjunctivitis. He was recently at [employer] and [employer] over the weekend.  History reviewed. No pertinent past medical history.   Social History   Socioeconomic History  . Marital status: Married    Spouse name: Not on file  . Number of children: Not on file  . Years of education: Not on file  . Highest education level: Not on file  Occupational History  . Not on file  Social Needs  . Financial resource strain: Not on file  . Food insecurity:    Worry: Not on file    Inability: Not on file  . Transportation needs:    Medical: Not on file    Non-medical: Not on file  Tobacco Use  . Smoking status: Never Smoker  . Smokeless tobacco: Never Used  Substance and Sexual Activity  . Alcohol use: Yes    Alcohol/week: 10.0 standard drinks    Types: 10 Standard drinks or equivalent per week  . Drug use: No  . Sexual activity: Never  Lifestyle  . Physical activity:    Days per week: Not on file    Minutes per session: Not on file  . Stress: Not on file  Relationships  . Social connections:    Talks on phone: Not on file    Gets  together: Not on file    Attends religious service: Not on file    Active member of club or organization: Not on file    Attends meetings of clubs or organizations: Not on file    Relationship status: Not on file  . Intimate partner violence:    Fear of current or ex partner: Not on file    Emotionally abused: Not on file    Physically abused: Not on file    Forced sexual activity: Not on file  Other Topics Concern  . Not on file  Social History Narrative  . Not on file    Past Surgical History:  Procedure Laterality Date  . LASIK  2008    Family History  Problem Relation Age of Onset  . Cancer Mother        hx breast  . COPD Father   . AAA (abdominal aortic aneurysm) Father     Allergies  Allergen Reactions  . Penicillins     Current Medications:   Current Outpatient Medications:  .  Cholecalciferol ([employer] VITAMIN D) 2000 units CAPS, Take 5,000 Units by mouth 2 (two) times daily. , Disp: , Rfl:  .  levothyroxine (SYNTHROID, LEVOTHROID) 75 MCG tablet, Take 1 tablet (75 mcg total) by mouth daily., Disp: 90 tablet,  Rfl: 3 .  Nutritional Supplements (JUICE PLUS FIBRE PO), Take by mouth., Disp: , Rfl:  .  Zinc 25 MG TABS, Take 50 mg by mouth daily. , Disp: , Rfl:  .  doxycycline (VIBRA-TABS) 100 MG tablet, Take 1 tablet (100 mg total) by mouth 2 (two) times daily., Disp: 20 tablet, Rfl: 0   Review of Systems:   Review of Systems  HENT: Negative for hoarse voice and trouble swallowing.   Respiratory: Positive for cough. Negative for shortness of breath.   Gastrointestinal: Negative for vomiting.  Musculoskeletal: Negative for neck pain.  Neurological: Negative for headaches.    Vitals:   Vitals:   08/17/18 1314  BP: 136/90  Pulse: 75  Temp: 98.2 F (36.8 C)  TempSrc: Oral  SpO2: 96%  Weight: 182 lb 6.1 oz (82.7 kg)  Height: 5\' 11"  (1.803 m)     Body mass index is 25.44 kg/m.  Physical Exam:   Physical Exam  Constitutional: He appears well-developed. He  is cooperative.  Non-toxic appearance. He does not have a sickly appearance. He does not appear ill. No distress.  HENT:  Head: Normocephalic and atraumatic.  Right Ear: Tympanic membrane, external ear and ear canal normal. Tympanic membrane is not erythematous, not retracted and not bulging.  Left Ear: Tympanic membrane, external ear and ear canal normal. Tympanic membrane is not erythematous, not retracted and not bulging.  Nose: Nose normal. Right sinus exhibits no maxillary sinus tenderness and no frontal sinus tenderness. Left sinus exhibits no maxillary sinus tenderness and no frontal sinus tenderness.  Mouth/Throat: Uvula is midline and mucous membranes are normal. Posterior oropharyngeal erythema present. No posterior oropharyngeal edema. Tonsils are 1+ on the right. Tonsils are 0 on the left. No tonsillar exudate.  Eyes: Conjunctivae and lids are normal.  Neck: Trachea normal.  Cardiovascular: Normal rate, regular rhythm, S1 normal, S2 normal and normal heart sounds.  Pulmonary/Chest: Effort normal and breath sounds normal. He has no decreased breath sounds. He has no wheezes. He has no rhonchi. He has no rales.  Lymphadenopathy:    He has cervical adenopathy.       Right cervical: Superficial cervical adenopathy present.  Neurological: He is alert.  Skin: Skin is warm, dry and intact.  Psychiatric: He has a normal mood and affect. His speech is normal and behavior is normal.  Nursing note and vitals reviewed.   Results for orders placed or performed in visit on 08/17/18  POCT rapid strep A  Result Value Ref Range   Rapid Strep A Screen Negative Negative    Assessment and Plan:    Ryan Lam was seen today for sore throat.  Diagnoses and all orders for this visit:  Upper respiratory tract infection, unspecified type No red flags on exam.  Strep negative. Will initiate supportive care, discussed that I have provided him with a safety net prescription of Doxycycline, should he  need it. Discussed taking medications as prescribed. Reviewed return precautions including worsening fever, SOB, worsening cough or other concerns. Push fluids and rest. I recommend that patient follow-up if symptoms worsen or persist despite treatment x 7-10 days, sooner if needed. -     POCT rapid strep A  Lymphadenopathy Likely from viral illness. Follow-up in 2 weeks if lymph node still present, will consider further work-up if needed. Patient verbalized understanding and is agreeable to plan.  Other orders -     doxycycline (VIBRA-TABS) 100 MG tablet; Take 1 tablet (100 mg total) by mouth 2 (two)  times daily.   . Reviewed expectations re: course of current medical issues. . Discussed self-management of symptoms. . Outlined signs and symptoms indicating need for more acute intervention. . Patient verbalized understanding and all questions were answered. . See orders for this visit as documented in the electronic medical record. . Patient received an After-Visit Summary.  CMA or LPN served as scribe during this visit. History, Physical, and Plan performed by medical provider. The above documentation has been reviewed and is accurate and complete.   Jarold Motto, PA-C

## 2018-09-07 ENCOUNTER — Encounter: Payer: Self-pay | Admitting: Family Medicine

## 2018-09-07 ENCOUNTER — Ambulatory Visit (INDEPENDENT_AMBULATORY_CARE_PROVIDER_SITE_OTHER): Payer: No Typology Code available for payment source | Admitting: Family Medicine

## 2018-09-07 VITALS — BP 136/94 | HR 65 | Temp 98.1°F | Wt 180.1 lb

## 2018-09-07 DIAGNOSIS — M545 Low back pain, unspecified: Secondary | ICD-10-CM

## 2018-09-07 DIAGNOSIS — Z23 Encounter for immunization: Secondary | ICD-10-CM

## 2018-09-07 DIAGNOSIS — N200 Calculus of kidney: Secondary | ICD-10-CM | POA: Diagnosis not present

## 2018-09-07 LAB — POCT URINALYSIS DIP (MANUAL ENTRY)
BILIRUBIN UA: NEGATIVE mg/dL
Bilirubin, UA: NEGATIVE
Blood, UA: NEGATIVE
Glucose, UA: NEGATIVE mg/dL
LEUKOCYTES UA: NEGATIVE
Nitrite, UA: NEGATIVE
Protein Ur, POC: NEGATIVE mg/dL
Spec Grav, UA: 1.02 (ref 1.010–1.025)
Urobilinogen, UA: 0.2 E.U./dL
pH, UA: 6 (ref 5.0–8.0)

## 2018-09-07 MED ORDER — TAMSULOSIN HCL 0.4 MG PO CAPS: 0.4000 mg | ORAL_CAPSULE | Freq: Every day | ORAL | 3 refills | Status: DC

## 2018-09-07 MED ORDER — DICLOFENAC SODIUM 75 MG PO TBEC: 75.0000 mg | DELAYED_RELEASE_TABLET | Freq: Two times a day (BID) | ORAL | 0 refills | Status: DC

## 2018-09-07 NOTE — Progress Notes (Signed)
   Subjective:  Ryan Lam is a 42 y.o. male who presents today for same-day appointment with a chief complaint of pelvic pain.   HPI:  Pelvic Pain, Acute problem Started about a week ago.  Associated with mild low back pain as well.  About a week ago also started having darker colored urine.  He took Pyridium which is helped a little bit.  Symptoms are somewhat reminiscent of past kidney and bladder stones.  He has passed "large" stones in the past but does not remember the specific size.  He has never had to have any procedure done to help facilitate passage of the stone.  No nausea or vomiting.  No fevers or chills.  No other obvious alleviating or aggravating factors.  His urologist has directed him to take lemon juice in the past to prevent kidney stones.  ROS: Per HPI  PMH: He reports that he has never smoked. He has never used smokeless tobacco. He reports that he drinks about 10.0 standard drinks of alcohol per week. He reports that he does not use drugs.  Objective:  Physical Exam: BP (!) 136/94   Pulse 65   Temp 98.1 F (36.7 C) (Oral)   Wt 180 lb 2 oz (81.7 kg)   SpO2 97%   BMI 25.12 kg/m   Gen: NAD, resting comfortably CV: RRR with no murmurs appreciated Pulm: NWOB, CTAB with no crackles, wheezes, or rhonchi GI: Normal bowel sounds present. Soft, Nontender, Nondistended. MSK: No edema, cyanosis, or clubbing noted. No CVA tenderness Skin: Warm, dry Neuro: Grossly normal, moves all extremities Psych: Normal affect and thought content  Results for orders placed or performed in visit on 09/07/18 (from the past 24 hour(s))  POCT urinalysis dipstick     Status: None   Collection Time: 09/07/18  1:21 PM  Result Value Ref Range   Color, UA yellow yellow   Clarity, UA clear clear   Glucose, UA negative negative mg/dL   Bilirubin, UA negative negative   Ketones, POC UA negative negative mg/dL   Spec Grav, UA 1.610 9.604 - 1.025   Blood, UA negative negative   pH, UA  6.0 5.0 - 8.0   Protein Ur, POC negative negative mg/dL   Urobilinogen, UA 0.2 0.2 or 1.0 E.U./dL   Nitrite, UA Negative Negative   Leukocytes, UA Negative Negative   Assessment/Plan:  Back Pain / History of kidney stones History most consistent with recurrent nephrolithiasis.  Will start Flomax and diclofenac today.  He does not have any red flag signs or symptoms.  Given that symptoms have been persistent for the past week, we discussed possible CT scan today, however patient declined.  He will continue with medical management for the next couple of days.  Discussed reasons to return to care and seek emergent care.  If symptoms do not resolve in the next 1 to 2 days, or if symptoms worsen, will need abdominal CT to rule out other possible causes and evaluate for hydronephrosis.  Kidney stones Patient is unsure the composition of his prior kidney stones.  Reports that his urologist recommended taking lemon juice to help increase his urine citrate.  Recommended patient try over-the-counter potassium citrate supplements instead, as he does not like taking lemon juice..  Preventative Healthcare Flu shot given today.   [name]. [name], MD 09/07/2018 1:52 PM

## 2018-09-07 NOTE — Patient Instructions (Addendum)
It was very nice to see you today!  You probably have a kidney stone.  Please start the diclofenac and flomax.  Please try taking potassium citrate supplements.  Let me know if your symptoms worse or do not improve in the next few days.  Take care, Dr [name]

## 2018-09-07 NOTE — Assessment & Plan Note (Signed)
Patient is unsure the composition of his prior kidney stones.  Reports that his urologist recommended taking lemon juice to help increase his urine citrate.  Recommended patient try over-the-counter potassium citrate supplements instead, as he does not like taking lemon juice.Marland Kitchen

## 2018-09-15 ENCOUNTER — Other Ambulatory Visit (INDEPENDENT_AMBULATORY_CARE_PROVIDER_SITE_OTHER): Payer: No Typology Code available for payment source

## 2018-09-15 DIAGNOSIS — J329 Chronic sinusitis, unspecified: Secondary | ICD-10-CM

## 2018-09-15 DIAGNOSIS — E039 Hypothyroidism, unspecified: Secondary | ICD-10-CM

## 2018-09-15 LAB — COMPREHENSIVE METABOLIC PANEL
ALT: 25 U/L (ref 0–53)
AST: 24 U/L (ref 0–37)
Albumin: 4.2 g/dL (ref 3.5–5.2)
Alkaline Phosphatase: 65 U/L (ref 39–117)
BILIRUBIN TOTAL: 0.6 mg/dL (ref 0.2–1.2)
BUN: 11 mg/dL (ref 6–23)
CALCIUM: 9.3 mg/dL (ref 8.4–10.5)
CO2: 29 meq/L (ref 19–32)
Chloride: 104 mEq/L (ref 96–112)
Creatinine, Ser: 1.08 mg/dL (ref 0.40–1.50)
GFR: 79.67 mL/min (ref >60.00)
Glucose, Bld: 129 mg/dL — ABNORMAL HIGH (ref 70–99)
POTASSIUM: 4.2 meq/L (ref 3.5–5.1)
Sodium: 139 mEq/L (ref 135–145)
TOTAL PROTEIN: 7.1 g/dL (ref 6.0–8.3)

## 2018-09-15 LAB — CBC
HCT: 44 % (ref 39.0–52.0)
HEMOGLOBIN: 14.7 g/dL (ref 13.0–17.0)
MCHC: 33.5 g/dL (ref 30.0–36.0)
MCV: 93.2 fl (ref 78.0–100.0)
PLATELETS: 226 10*3/uL (ref 150.0–400.0)
RBC: 4.72 Mil/uL (ref 4.22–5.81)
RDW: 13.8 % (ref 11.5–15.5)
WBC: 5 10*3/uL (ref 4.0–10.5)

## 2018-09-15 LAB — TESTOSTERONE: Testosterone: 198.07 ng/dL — ABNORMAL LOW (ref 300.00–890.00)

## 2018-09-15 LAB — TSH: TSH: 5.65 u[IU]/mL — AB (ref 0.35–4.50)

## 2018-09-15 NOTE — Progress Notes (Signed)
Please inform patient of the following:  His testosterone is low and has been persistently low for several years. Would recommend OV to discuss replacement options if he is interested. His thyroid level was a little off. Would like for him for him to come back in 4-6 weeks to recheck. Please place future order for TSH and free T4.  His blood sugar was elevated - not sure if he was fasting or not. If not, would like to have this recheck when comes back for the above. The rest of his labs are normal.   [name]. [name], MD 09/15/2018 5:01 PM

## 2018-09-17 ENCOUNTER — Encounter: Payer: Self-pay | Admitting: Family Medicine

## 2018-09-17 ENCOUNTER — Other Ambulatory Visit: Payer: Self-pay

## 2018-09-17 DIAGNOSIS — E039 Hypothyroidism, unspecified: Secondary | ICD-10-CM

## 2018-10-05 ENCOUNTER — Telehealth: Payer: Self-pay | Admitting: Family Medicine

## 2018-10-05 ENCOUNTER — Other Ambulatory Visit: Payer: Self-pay

## 2018-10-05 ENCOUNTER — Encounter: Payer: Self-pay | Admitting: Family Medicine

## 2018-10-05 MED ORDER — LEVOTHYROXINE SODIUM 75 MCG PO TABS: 75.0000 ug | ORAL_TABLET | Freq: Every day | ORAL | 3 refills | Status: DC

## 2018-10-05 NOTE — Telephone Encounter (Signed)
See note

## 2018-10-05 NOTE — Telephone Encounter (Signed)
Copied from CRM [id number]. Topic: Quick Communication - See Telephone Encounter >> Oct 05, 2018  9:57 AM Windy KalataMichael, Javani Spratt L, NT wrote: CRM for notification. See Telephone encounter for: 10/05/18.  Patient is calling and requesting a refill on levothyroxine (SYNTHROID, LEVOTHROID) 75 MCG tablet.  [facility] [address] - [place], [place] - [address] [place] [address] Phone: [phone] Fax: [phone]

## 2018-10-05 NOTE — Telephone Encounter (Signed)
Rx sent to pharmacy   

## 2018-10-07 ENCOUNTER — Ambulatory Visit (INDEPENDENT_AMBULATORY_CARE_PROVIDER_SITE_OTHER): Payer: No Typology Code available for payment source | Admitting: Family Medicine

## 2018-10-07 ENCOUNTER — Encounter: Payer: Self-pay | Admitting: Family Medicine

## 2018-10-07 VITALS — BP 122/74 | HR 89 | Temp 98.0°F | Ht 71.0 in | Wt 181.6 lb

## 2018-10-07 DIAGNOSIS — G4730 Sleep apnea, unspecified: Secondary | ICD-10-CM

## 2018-10-07 DIAGNOSIS — K4090 Unilateral inguinal hernia, without obstruction or gangrene, not specified as recurrent: Secondary | ICD-10-CM

## 2018-10-07 DIAGNOSIS — J329 Chronic sinusitis, unspecified: Secondary | ICD-10-CM | POA: Diagnosis not present

## 2018-10-07 DIAGNOSIS — G4733 Obstructive sleep apnea (adult) (pediatric): Secondary | ICD-10-CM | POA: Insufficient documentation

## 2018-10-07 DIAGNOSIS — E039 Hypothyroidism, unspecified: Secondary | ICD-10-CM

## 2018-10-07 DIAGNOSIS — N50812 Left testicular pain: Secondary | ICD-10-CM | POA: Diagnosis not present

## 2018-10-07 NOTE — Assessment & Plan Note (Signed)
Stable.  Continue over-the-counter allergy medications.  Check CT maxillofacial scan.

## 2018-10-07 NOTE — Assessment & Plan Note (Signed)
-   Referred for sleep study.

## 2018-10-07 NOTE — Patient Instructions (Addendum)
It was very nice to see you today!  You have a hernia.  This is causing your testicular pain.  Will place referral to surgeon to discuss options for you.  We will check an ultrasound of your testicle to make sure there is nothing else going on.  I will also set you up to have a CT scan done of your sinuses.  Please come back soon to recheck your thyroid level.  Take care, Dr Jimmey RalphParker   Inguinal Hernia, Adult An inguinal hernia is when fat or the intestines push through the area where the leg meets the lower belly (groin) and make a rounded lump (bulge). This condition happens over time. There are three types of inguinal hernias. These types include:  Hernias that can be pushed back into the belly (are reducible).  Hernias that cannot be pushed back into the belly (are incarcerated).  Hernias that cannot be pushed back into the belly and lose their blood supply (get strangulated). This type needs emergency surgery.  Follow these instructions at home: Lifestyle  Drink enough fluid to keep your urine (pee) clear or pale yellow.  Eat plenty of fruits, vegetables, and whole grains. These have a lot of fiber. Talk with your doctor if you have questions.  Avoid lifting heavy objects.  Avoid standing for long periods of time.  Do not use tobacco products. These include cigarettes, chewing tobacco, or e-cigarettes. If you need help quitting, ask your doctor.  Try to stay at a healthy weight. General instructions  Do not try to force the hernia back in.  Watch your hernia for any changes in color or size. Let your doctor know if there are any changes.  Take over-the-counter and prescription medicines only as told by your doctor.  Keep all follow-up visits as told by your doctor. This is important. Contact a doctor if:  You have a fever.  You have new symptoms.  Your symptoms get worse. Get help right away if:  The area where the legs meets the lower belly has: ? Pain that  gets worse suddenly. ? A bulge that gets bigger suddenly and does not go down. ? A bulge that turns red or purple. ? A bulge that is painful to the touch.  You are a man and your scrotum: ? Suddenly feels painful. ? Suddenly changes in size.  You feel sick to your stomach (nauseous) and this feeling does not go away.  You throw up (vomit) and this keeps happening.  You feel your heart beating a lot more quickly than normal.  You cannot poop (have a bowel movement) or pass gas. This information is not intended to replace advice given to you by your health care provider. Make sure you discuss any questions you have with your health care provider. Document Released: 12/05/2006 Document Revised: 04/11/2016 Document Reviewed: 09/14/2014 Elsevier Interactive Patient Education  2018 [employer]

## 2018-10-07 NOTE — Assessment & Plan Note (Signed)
Last TSH mildly elevated.  Continue Synthroid 25 mcg daily.  He will follow-up here in 1 to 2 weeks to recheck TSH and free T4.

## 2018-10-07 NOTE — Progress Notes (Signed)
   Subjective:  Ryan Lam is a 42 y.o. male who presents today for same-day appointment with a chief complaint of testicular pain.   HPI:  Testicular Pain, Acute problem Started about a week ago. Woke up from sleep with the pain.  Pain is located in his left testicle.  Patient noticed a lump to the bottom of his left testicle about a week ago as well.  Symptoms have modestly improved over the last week.  No obvious precipitating events.  Pain is little bit worse with touch to the area.  No other obvious alleviating or aggravating factors.  No hematuria.  No dysuria.  No frequent urination.  No nausea or vomiting.  No constipation.  No pain with cough, sneeze, or heavy lifting.  Sinusitis Chronic problem for patient.  Stable.  Was told that he had a sinus cyst in the past and will need to be further evaluated.  Sleep disordered breathing Several year history.  Wife tells him that he occasionally snores.  He feels very fatigued during the day.  ROS: Per HPI  PMH: He reports that he has never smoked. He has never used smokeless tobacco. He reports that he drinks about 10.0 standard drinks of alcohol per week. He reports that he does not use drugs.  Objective:  Physical Exam: BP 122/74 (BP Location: Left Arm, Patient Position: Sitting, Cuff Size: Normal)   Pulse 89   Temp 98 F (36.7 C) (Oral)   Ht 5\' 11"  (1.803 m)   Wt 181 lb 9.6 oz (82.4 kg)   SpO2 96%   BMI 25.33 kg/m   Gen: NAD, resting comfortably HEENT: TMs clear.  Rightward nasal septal deviation noted. CV: RRR with no murmurs appreciated Pulm: NWOB, CTAB with no crackles, wheezes, or rhonchi GI: Normal bowel sounds present. Soft, Nontender, Nondistended. GU: Small nodule noted on apex of left testicle.  Mildly tender to palpation.  Left inguinal hernia noted.  Palpation of this area reproduces his pain.   Assessment/Plan:  Inguinal hernia Likely the source of his testicular pain.  Discussed treatment options.  We  will refer him to surgery for further evaluation management.  Discussed reasons to return to care and seek emergent care.  Testicular pain/lump Likely varicocele or scar tissue related to his vasectomy.  Given pain to the area, will check ultrasound to rule out malignancy or other potential causes.  Sleep-disordered breathing Referred for sleep study.  Sinusitis Stable.  Continue over-the-counter allergy medications.  Check CT maxillofacial scan.  Hypothyroidism Last TSH mildly elevated.  Continue Synthroid 25 mcg daily.  He will follow-up here in 1 to 2 weeks to recheck TSH and free T4.  [name] RalphParker, MD 10/07/2018 12:11 PM

## 2018-10-19 ENCOUNTER — Encounter: Payer: Self-pay | Admitting: Family Medicine

## 2018-10-19 ENCOUNTER — Other Ambulatory Visit (INDEPENDENT_AMBULATORY_CARE_PROVIDER_SITE_OTHER): Payer: BLUE CROSS/BLUE SHIELD

## 2018-10-19 ENCOUNTER — Other Ambulatory Visit: Payer: Self-pay

## 2018-10-19 ENCOUNTER — Other Ambulatory Visit: Payer: Self-pay | Admitting: Family Medicine

## 2018-10-19 DIAGNOSIS — E039 Hypothyroidism, unspecified: Secondary | ICD-10-CM

## 2018-10-19 LAB — T4, FREE: Free T4: 0.76 ng/dL (ref 0.60–1.60)

## 2018-10-19 LAB — TSH: TSH: 10.59 u[IU]/mL — ABNORMAL HIGH (ref 0.35–4.50)

## 2018-10-19 MED ORDER — LEVOTHYROXINE SODIUM 88 MCG PO TABS: 88.0000 ug | ORAL_TABLET | Freq: Every day | ORAL | 1 refills | Status: DC

## 2018-10-19 NOTE — Progress Notes (Signed)
Please inform patient of the following:  His thyroid numbers are still off. Would like to increase dose of synthroid to 88mcg (please send in new rx if needed). He should come back in 4-6 to have it rechecked.  [name] RalphParker, MD 10/19/2018 1:14 PM

## 2018-10-20 ENCOUNTER — Ambulatory Visit (INDEPENDENT_AMBULATORY_CARE_PROVIDER_SITE_OTHER)
Admission: RE | Admit: 2018-10-20 | Discharge: 2018-10-20 | Disposition: A | Discharge status: Home / Self Care | Payer: BLUE CROSS/BLUE SHIELD | Source: Ambulatory Visit | Attending: Family Medicine | Admitting: Family Medicine

## 2018-10-20 DIAGNOSIS — J3489 Other specified disorders of nose and nasal sinuses: Secondary | ICD-10-CM | POA: Diagnosis not present

## 2018-10-20 DIAGNOSIS — J329 Chronic sinusitis, unspecified: Secondary | ICD-10-CM | POA: Diagnosis not present

## 2018-10-21 NOTE — Progress Notes (Signed)
Please inform patient of the following:  His CT scan showed some inflammation in his sinuses that is most likely due to allergies. There were no signs of infection and no cyst visualized.  I would like for him to make sure he is using daily allergy med such as zyrtec and a daily nasal spray such as flonase. Should let us know if it continues to be problematic and we can refer him to ENT.  [name] RalphParker, MD 10/21/2018 8:27 AM

## 2018-11-04 DIAGNOSIS — K4090 Unilateral inguinal hernia, without obstruction or gangrene, not specified as recurrent: Secondary | ICD-10-CM | POA: Diagnosis not present

## 2018-11-24 ENCOUNTER — Other Ambulatory Visit: Payer: Self-pay

## 2018-11-24 ENCOUNTER — Telehealth: Payer: Self-pay | Admitting: Family Medicine

## 2018-11-24 DIAGNOSIS — N50812 Left testicular pain: Secondary | ICD-10-CM

## 2018-11-24 NOTE — Telephone Encounter (Signed)
See note  Copied from CRM [id number]. Topic: General - Other >> Nov 24, 2018  3:15 PM Jaquita Rector A wrote: Reason for CRM: [facility] radiology called to request orders for patient Scrotum ultrasound. Stated that patient called to schedule but they need the order to read Scrotum with Doppler [id number]. Please address. Thank you

## 2018-11-24 NOTE — Telephone Encounter (Signed)
Order has been placed.

## 2018-12-03 ENCOUNTER — Ambulatory Visit (HOSPITAL_COMMUNITY): Payer: BLUE CROSS/BLUE SHIELD

## 2018-12-03 ENCOUNTER — Encounter: Payer: Self-pay | Admitting: Family Medicine

## 2018-12-07 ENCOUNTER — Encounter: Payer: Self-pay | Admitting: Neurology

## 2018-12-07 ENCOUNTER — Ambulatory Visit: Payer: BLUE CROSS/BLUE SHIELD | Admitting: Neurology

## 2018-12-07 VITALS — BP 140/97 | HR 70 | Ht 71.0 in | Wt 190.0 lb

## 2018-12-07 DIAGNOSIS — E039 Hypothyroidism, unspecified: Secondary | ICD-10-CM | POA: Diagnosis not present

## 2018-12-07 DIAGNOSIS — G4730 Sleep apnea, unspecified: Secondary | ICD-10-CM

## 2018-12-07 DIAGNOSIS — M2619 Other specified anomalies of jaw-cranial base relationship: Secondary | ICD-10-CM

## 2018-12-07 DIAGNOSIS — J329 Chronic sinusitis, unspecified: Secondary | ICD-10-CM

## 2018-12-07 DIAGNOSIS — J342 Deviated nasal septum: Secondary | ICD-10-CM

## 2018-12-07 DIAGNOSIS — N200 Calculus of kidney: Secondary | ICD-10-CM

## 2018-12-07 NOTE — Progress Notes (Signed)
SLEEP MEDICINE CLINIC   Provider:  Melvyn Novasarmen  Maximo Spratling, MD   Primary Care Physician:  Ardith DarkParker, Caleb M, MD   Referring Provider: Ardith DarkParker, Caleb M, MD   Chief Complaint  Patient presents with  . New Patient (Initial Visit)    pt alone, rm 10. pt states that he was talking to PCP and mentioned that he has been feeling fatigue more lately. his wife told him he snores. he has never had sleep study before. averages 7 hrs of sleep. feels like he has always felt groggy in the mornings. has been complaining of sinus related concerns.     HPI:  Ryan Lam is a 43 y.o. male patient  , seen on 12-07-2018  in a referral by Dr. Jimmey RalphParker for evaluation of snoring, fatigue, frequent sinusitis.  Chief complaint according to patient : " I have 2 young kids , 824 and 652.43 years old, and I am busy- fatigue may be related to that". "Fatigue has increased and I used to be a night owl."   Sleep habits are as follows: dinner time is 6 pm- after returning from his new office 10 minutes away. Before 2019 he worked from home.   " I am now no longer able to stay late for work, too sleepy". "Once the kids are in bed by 8.30 pm I am ready to go to bed myself. I lay down with the children to let them go to sleep, and I could go to sleep right there. I am in bed usually by 10 Pm. "  He uses now a humidifier in the bedroom. One of the three dog sleeps in the bedroom.  The marital bedroom is cool, quiet and dark- his wife gets up a lot in the middle of the night, this wakes him, too. He sleeps on his side, on one pillow. His children sometimes come to the parent's bed. He has at least 2 bathroom breaks each night.  Has a dry mouth, drinks water - has it at the bed stand.  He has not needed an alarm- he wakes spontaneously at 6.30-7 , when his child wakes up.  He feels not as rested or refreshed, always wishes he could sleep longer. " never been a morning person". He always used the snooze button several times.    Sleep  medical history:  Used to be a night owl from teenage on, stayed up late every night in college. Recently more fragmented sleep, ? due to kids.  Family sleep history: father was a snorer, who had COPD, CHF, smoker, died of lung cancer. Patient is a single child.   Social history: married, 2 children, non smoker,  ETOH at dinner or after the kids go to bed, 2 drinks, several days a week.  Caffeine - 2 cups max in AM , and water all day. Seldomly tea or soda.   Review of Systems: Out of a complete 14 system review, the patient complains of only the following symptoms, and all other reviewed systems are negative. Thyroid disease diagnosed in hypothyroidism - age 43.  Now on synthroid 7088 mcgr with elevated TSH-  fatigue Snoring. Usually sleeps 7 hours    Epworth Sleepiness Score : 03/ 24 points  , Fatigue severity score 27/ 63 points   , depression score n/a    Social History   Socioeconomic History  . Marital status: Married    Spouse name: Not on file  . Number of children: Not on file  . Years of  education: Not on file  . Highest education level: Not on file  Occupational History  . Not on file  Social Needs  . Financial resource strain: Not on file  . Food insecurity:    Worry: Not on file    Inability: Not on file  . Transportation needs:    Medical: Not on file    Non-medical: Not on file  Tobacco Use  . Smoking status: Never Smoker  . Smokeless tobacco: Never Used  Substance and Sexual Activity  . Alcohol use: Yes    Alcohol/week: 10.0 standard drinks    Types: 10 Standard drinks or equivalent per week  . Drug use: No  . Sexual activity: Never  Lifestyle  . Physical activity:    Days per week: Not on file    Minutes per session: Not on file  . Stress: Not on file  Relationships  . Social connections:    Talks on phone: Not on file    Gets together: Not on file    Attends religious service: Not on file    Active member of club or organization: Not on file     Attends meetings of clubs or organizations: Not on file    Relationship status: Not on file  . Intimate partner violence:    Fear of current or ex partner: Not on file    Emotionally abused: Not on file    Physically abused: Not on file    Forced sexual activity: Not on file  Other Topics Concern  . Not on file  Social History Narrative  . Not on file    Family History  Problem Relation Age of Onset  . Cancer Mother        hx breast  . COPD Father   . AAA (abdominal aortic aneurysm) Father   . Diabetes Maternal Grandmother   . Stroke Maternal Grandfather     No past medical history on file.  Past Surgical History:  Procedure Laterality Date  . LASIK  2008    Current Outpatient Medications  Medication Sig Dispense Refill  . Cholecalciferol ([employer] VITAMIN D) 2000 units CAPS Take 5,000 Units by mouth 2 (two) times daily.     Marland Kitchen levothyroxine (SYNTHROID, LEVOTHROID) 88 MCG tablet Take 1 tablet (88 mcg total) by mouth daily. 90 tablet 1  . Nutritional Supplements (JUICE PLUS FIBRE PO) Take by mouth.    . Zinc 25 MG TABS Take 50 mg by mouth daily.      No current facility-administered medications for this visit.    CLINICAL DATA:  Initial evaluation for recurrent sinusitis, chronic nasal congestion.  EXAM: CT PARANASAL SINUS LIMITED WITHOUT CONTRAST  TECHNIQUE: Non-contiguous multidetector CT images of the paranasal sinuses were obtained in a single plane without contrast.  COMPARISON:  None.  FINDINGS: Limited coronal views of the sinuses demonstrate mild circumferential mucosal thickening involving the left frontal sinus. Mucosal thickening with opacity within the left greater than right ethmoidal air cells. Mild circumferential mucosal thickening within the sphenoid sinuses bilaterally. Mild-to-moderate mucosal thickening within the inferior left maxillary sinus, with more mild mucoperiosteal thickening within the inferior right maxillary sinus. Mucosal  thickening extends to involve the ostiomeatal units bilaterally, left greater than right.  Mild S-shaped bowing of the nasal septum which is grossly intact. No associated concha bullosa. Mucosal hypertrophy about the left inferior turbinate with partial obscuration of the inferior left nasal passage. Nasal passages otherwise patent.  No appreciable air-fluid levels to suggest acute sinusitis at this  time.  Visualized globes and orbital soft tissues within normal limits. Remainder the visualized soft tissues of the face unremarkable.  Visualized intracranial contents unremarkable.  IMPRESSION: Mild-to-moderate mucoperiosteal thickening throughout the paranasal sinuses as above, left greater than right. Findings likely are allergic/inflammatory nature. No air-fluid levels to suggest acute sinusitis at this time.   Electronically Signed   By: Rise Mu M.D.   On: 10/20/2018 23:04 Allergies as of 12/07/2018 - Review Complete 12/07/2018  Allergen Reaction Noted  . Penicillins  10/26/2013    Vitals: BP (!) 140/97   Pulse 70   Ht 5\' 11"  (1.803 m)   Wt 190 lb (86.2 kg)   BMI 26.50 kg/m   Respiratory rate 12 /min.  Last Weight:  Wt Readings from Last 1 Encounters:  12/07/18 190 lb (86.2 kg)   FXJ:OITG mass index is 26.5 kg/m.     Last Height:   Ht Readings from Last 1 Encounters:  12/07/18 5\' 11"  (1.803 m)    Physical exam:  General: The patient is awake, alert and appears not in acute distress. The patient is well groomed. Head: Normocephalic, atraumatic. Neck is supple. Mallampati 3 , tip of uvula remains hidden. ,  neck circumference:16. 5 , slender - Nasal airflow congestion , bruxism marks evident . Retrognathia is seen.  Cardiovascular:  Regular rate and rhythm , without  murmurs or carotid bruit, and without distended neck veins. Respiratory: Lungs are clear to auscultation. Skin:  Without evidence of edema, or rash Trunk: BMI is 26. The  patient's posture is erect.   Neurologic exam : The patient is awake and alert, oriented to place and time.   Memory subjective described as intact.   Attention span & concentration ability appears normal.  Speech is fluent,  without dysarthria, dysphonia or aphasia.  Mood and affect are appropriate.  Cranial nerves: Pupils are equal and briskly reactive to light.  Extraocular movements  in vertical and horizontal planes intact and without nystagmus. Visual fields by finger perimetry are intact. Hearing to finger rub intact. Facial sensation intact to fine touch. Facial motor strength is symmetric and tongue and uvula move midline. Shoulder shrug was symmetrical.   Motor exam:   Normal tone, muscle bulk and symmetric strength in all extremities.  Sensory:  Fine touch, pinprick and vibration were tested in all extremities. Proprioception tested in the upper extremities was normal.  Coordination: Rapid alternating movements in the fingers/hands was normal. Finger-to-nose maneuver  normal without evidence of ataxia, dysmetria or tremor. No changes in penmanship.   Gait and station: Patient walks without assistive device and is able unassisted to climb up to the exam table. Strength within normal limits.  Stance is stable and normal.  Turns with 3  Steps.  Deep tendon reflexes: in the  upper and lower extremities are symmetric and intact. Babinski maneuver response is downgoing.   Assessment:  After physical and neurologic examination, review of laboratory studies,  Personal review of imaging studies, reports of other /same  Imaging studies, results of polysomnography and / or neurophysiology testing and pre-existing records as far as provided in visit., my assessment is   1) External sleep intrusions, by the 98 year old child.   2) he is a snorer, has bruxism and minor fatigue and sleepiness issues.  3) sinus congestion and nasal septal deviation- has had a dental X ray that looked at  maxillary sinus. 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   The patient was advised of the nature of  the diagnosed disorder , the treatment options and the  risks for general health and wellness arising from not treating the condition.   I spent more than  35  minutes of face to face time with the patient.  Greater than 50% of time was spent in counseling and coordination of care. We have discussed the diagnosis and differential and I answered the patient's questions.    Plan:  Treatment plan and additional workup :  Attended sleep study to evaluate apnea, snoring and Bruxism- will need extra attention to TMJ movements.  I will also ask for the patient to sleep supine, and will ask for RERAS to be scored. He unlearned to sleep prone- unclear why.   Need AHi and RDI.   Melvyn NovasARMEN Wright Gravely, MD 12/07/2018, 1:14 PM  Certified in Neurology by ABPN Certified in Sleep Medicine by [facility]  [facility] [address], [address] [place], [place] [id number]

## 2018-12-08 ENCOUNTER — Telehealth: Payer: Self-pay

## 2018-12-08 ENCOUNTER — Other Ambulatory Visit: Payer: Self-pay | Admitting: Neurology

## 2018-12-08 DIAGNOSIS — E039 Hypothyroidism, unspecified: Secondary | ICD-10-CM

## 2018-12-08 DIAGNOSIS — J329 Chronic sinusitis, unspecified: Secondary | ICD-10-CM

## 2018-12-08 DIAGNOSIS — J342 Deviated nasal septum: Secondary | ICD-10-CM

## 2018-12-08 DIAGNOSIS — G4730 Sleep apnea, unspecified: Secondary | ICD-10-CM

## 2018-12-08 NOTE — Telephone Encounter (Signed)
[employer] denied in lab sleep study, does not meet criteria, Need HST order

## 2018-12-08 NOTE — Telephone Encounter (Signed)
Casey placed order

## 2018-12-28 ENCOUNTER — Ambulatory Visit (INDEPENDENT_AMBULATORY_CARE_PROVIDER_SITE_OTHER): Payer: BLUE CROSS/BLUE SHIELD | Admitting: Neurology

## 2018-12-28 DIAGNOSIS — G4730 Sleep apnea, unspecified: Secondary | ICD-10-CM

## 2018-12-28 DIAGNOSIS — J329 Chronic sinusitis, unspecified: Secondary | ICD-10-CM

## 2018-12-28 DIAGNOSIS — G4733 Obstructive sleep apnea (adult) (pediatric): Secondary | ICD-10-CM | POA: Diagnosis not present

## 2018-12-28 DIAGNOSIS — E039 Hypothyroidism, unspecified: Secondary | ICD-10-CM

## 2018-12-28 DIAGNOSIS — J342 Deviated nasal septum: Secondary | ICD-10-CM

## 2018-12-30 ENCOUNTER — Ambulatory Visit: Payer: Self-pay | Admitting: General Surgery

## 2019-01-05 NOTE — Procedures (Signed)
NAME:   Ryan Lam                                                                       DOB: [birth year] MEDICAL RECORD No:   [id number]                                                     DOS:  12/30/2018 REFERRING PHYSICIAN: Jacquiline Doe, MD STUDY PERFORMED: Home Sleep Test on [facility] HISTORY: [name] is a 43 y.o. male patient who was seen on 12-07-2018 in a referral by Dr. [name] for evaluation of snoring, fatigue, in the setting of frequent sinusitis. Chief complaint according to patient: " I have 2 young kids , 70 and 41.21 years old, and I am busy- fatigue may be related to that". "Fatigue has increased and I used to be a night owl."  " I am now no longer able to stay late for work, too sleepy". "Once the kids are in bed by 8.30 pm I am ready to go to bed myself."  He has at least 2 bathroom breaks each night. Has a dry mouth, drinks water - has it at the bed stand. He has not needed an alarm- he wakes spontaneously at 6.30 -7 AM, when his child wakes up.  He feels not as rested or refreshed, always wishes he could sleep longer.  Epworth Sleepiness Score: 03/ 24 points, Fatigue severity score 27/ 63 points, BMI: 26.5 kg/m2  STUDY RESULTS:  Total Recording Time: 9 h ; Total Sleep Time:   7 h 22 mins Total Apnea/Hypopnea Index (AHI): 10.9 /h; RDI:  14.4/h; REM RDI: 20.5 /h Average Oxygen Saturation:  94 %; Lowest Oxygen Saturation: 90 %  Total Time in Oxygen Saturation below 89 %:  0.0 minutes.  Average Heart Rate:  71 bpm (between 52 and 93 bpm). IMPRESSION: There is mild obstructive Sleep Apnea present - moderate snoring exacerbates in REM sleep. No hypoxemia.  RECOMMENDATION: This mild apnea is optionally treated with CPAP and could respond to a dental device.  If Mr. Moos has a preference, I will follow his request. If he likes to try auto CPAP, we can arrange for this easily and meet after a 60-90 day period.  I certify that I have reviewed the raw data recording prior  to the issuance of this report in accordance with the standards of the American Academy of Sleep Medicine (AASM). Melvyn Novas, M.D.    01-05-2019   Medical Director of [facility], accredited by the AASM. Diplomat of the ABPN and ABSM.

## 2019-01-06 ENCOUNTER — Telehealth: Payer: Self-pay | Admitting: Neurology

## 2019-01-06 ENCOUNTER — Encounter: Payer: Self-pay | Admitting: Neurology

## 2019-01-06 NOTE — Telephone Encounter (Signed)
-----   Message from Melvyn Novas, MD sent at 01/05/2019  5:18 PM EST ----- IMPRESSION: There is mild obstructive Sleep Apnea present -  moderate snoring exacerbates in REM sleep. No hypoxemia.  RECOMMENDATION: This mild apnea is optionally treated with CPAP  and could respond to a dental device. If Ryan Lam has a  preference, I will follow his request.  If he likes to try autotitration CPAP, we can arrange for this easily and meet after a 60-90 day  Period.  Please let me know !

## 2019-01-06 NOTE — Telephone Encounter (Signed)
Called the pt to review the sleep study with him. No answer and was unable to leave a message due to mail box being full. I will send a mychart message also.

## 2019-01-07 ENCOUNTER — Encounter: Payer: Self-pay | Admitting: Neurology

## 2019-01-16 DIAGNOSIS — R079 Chest pain, unspecified: Secondary | ICD-10-CM

## 2019-01-16 HISTORY — DX: Chest pain, unspecified: R07.9

## 2019-01-18 ENCOUNTER — Encounter (HOSPITAL_BASED_OUTPATIENT_CLINIC_OR_DEPARTMENT_OTHER): Payer: Self-pay | Admitting: *Deleted

## 2019-01-18 ENCOUNTER — Other Ambulatory Visit: Payer: Self-pay

## 2019-01-18 NOTE — Progress Notes (Signed)
Leitha BleakPhillip C Chard        Your procedure is scheduled on 01-20-2019  [facility] [place]  at  745 A.M.  Call this number if you have problems the morning of [phone]  OUR ADDRESS IS [address], WE ARE LOCATED IN THE [facility]ICAL [facility] WITH [facility].   Remember:  Do not eat food or drink liquids after midnight.  Take these medicines the morning of surgery with A SIP OF WATER: LEVOTHYROXINE   Do not wear jewelry, make-up or nail polish.  Do not wear lotions, powders, or perfumes, or deoderant.  Do not shave 48 hours prior to surgery.  Men may shave face and neck.  Do not bring valuables to the hospital.  [facility] is not [facility] for any belongings or valuables.  Contacts, dentures or bridgework may not be worn into surgery.  Leave your suitcase in the car.  After surgery it may be brought to your room.  For patients admitted to the hospital, discharge time will be determined by your treatment team.  Patients discharged the day of surgery will not be allowed to drive home.   Special instructions:   Please read over the following fact sheets that you were given:  DRIVER MOTHER LINDA      NO SOLID FOOD AFTER MIDNIGHT THE NIGHT PRIOR TO SURGERY. NOTHING BY MOUTH EXCEPT CLEAR LIQUIDS UNTIL 3 HOURS PRIOR TO SCHEULED SURGERY. PLEASE FINISH ENSURE DRINK PER SURGEON ORDER 3 HOURS PRIOR TO SCHEDULE AT 645 AM.    CLEAR LIQUID DIET   Foods Allowed                                                                     Foods Excluded  Coffee and tea, regular and decaf                             liquids that you cannot  Plain Jell-O in any flavor                                             see through such as: Fruit ices (not with fruit pulp)                                     milk, soups, orange juice  Iced Popsicles                                    All solid food Carbonated beverages, regular and diet                                     Cranberry, grape and apple juices Sports drinks like Gatorade Lightly seasoned clear broth or consume(fat free) Sugar, honey syrup  Sample Menu Breakfast  Lunch                                     Supper Cranberry juice                    Beef broth                            Chicken broth Jell-O                                     Grape juice                           Apple juice Coffee or tea                        Jell-O                                      Popsicle                                                Coffee or tea                        Coffee or tea  _____________________________________________________________________  [facility] - Preparing for Surgery Before surgery, you can play an important role.  Because skin is not sterile, your skin needs to be as free of germs as possible.  You can reduce the number of germs on your skin by washing with CHG (chlorahexidine gluconate) soap before surgery.  CHG is an antiseptic cleaner which kills germs and bonds with the skin to continue killing germs even after washing. Please DO NOT use if you have an allergy to CHG or antibacterial soaps.  If your skin becomes reddened/irritated stop using the CHG and inform your nurse when you arrive at Short Stay. Do not shave (including legs and underarms) for at least 48 hours prior to the first CHG shower.  You may shave your face/neck. Please follow these instructions carefully:  1.  Shower with CHG Soap the night before surgery and the  morning of Surgery.  2.  If you choose to wash your hair, wash your hair first as usual with your  normal  shampoo.  3.  After you shampoo, rinse your hair and body thoroughly to remove the  shampoo.                           4.  Use CHG as you would any other liquid soap.  You can apply chg directly  to the skin and wash                       Gently with a scrungie or clean washcloth.  5.  Apply the CHG Soap to your body ONLY  FROM THE NECK DOWN.   Do not use on face/ open  Wound or open sores. Avoid contact with eyes, ears mouth and genitals (private parts).                       Wash face,  Genitals (private parts) with your normal soap.             6.  Wash thoroughly, paying special attention to the area where your surgery  will be performed.  7.  Thoroughly rinse your body with warm water from the neck down.  8.  DO NOT shower/wash with your normal soap after using and rinsing off  the CHG Soap.                9.  Pat yourself dry with a clean towel.            10.  Wear clean pajamas.            11.  Place clean sheets on your bed the night of your first shower and do not  sleep with pets. Day of Surgery : Do not apply any lotions/deodorants the morning of surgery.  Please wear clean clothes to the hospital/surgery center.   ________________________________________________________________________

## 2019-01-18 NOTE — Progress Notes (Signed)
Spoke with Claus Npo after midnight food, clear liquids until 645 am and drink pre surgery shake at 645 am then npo Arrive 745 am 01-20-19 [facility] meds to take levothyroxine Patient reports chest pain Saturday 01-16-19 and area tender when touching the area. Spoke with Shanda Bumps zanetto pa and patient to come in for pre op ekg Patient made lab appointment for ekg 01-19-2019 at 200 pm Patient instructed hibiclens showers hs and am of surgery Has surgery orders in epic

## 2019-01-19 ENCOUNTER — Encounter (HOSPITAL_COMMUNITY): Admission: RE | Admit: 2019-01-19 | Payer: BLUE CROSS/BLUE SHIELD | Source: Ambulatory Visit

## 2019-01-19 NOTE — Progress Notes (Signed)
Pt came by to pick up ensure pre-surgery drink and hibiclens.  Noted nurse note dated 01-18-2019 that pt is to get ekg ordered by anesthesia PA, Jodell Cipro.  Had pt to speak to Shanda Bumps today about the chest discomfort only when he presses down near side of  breast bone below breast area.  Per Shanda Bumps no EKG needed. Reviewed ensure and hibiclens instructions, pt verbalized understanding.

## 2019-01-20 ENCOUNTER — Ambulatory Visit (HOSPITAL_BASED_OUTPATIENT_CLINIC_OR_DEPARTMENT_OTHER)
Admission: RE | Admit: 2019-01-20 | Discharge: 2019-01-20 | Disposition: A | Discharge status: Home / Self Care | Payer: BLUE CROSS/BLUE SHIELD | Attending: General Surgery | Admitting: General Surgery

## 2019-01-20 ENCOUNTER — Encounter (HOSPITAL_BASED_OUTPATIENT_CLINIC_OR_DEPARTMENT_OTHER): Payer: Self-pay

## 2019-01-20 ENCOUNTER — Ambulatory Visit (HOSPITAL_BASED_OUTPATIENT_CLINIC_OR_DEPARTMENT_OTHER): Payer: BLUE CROSS/BLUE SHIELD | Admitting: Anesthesiology

## 2019-01-20 ENCOUNTER — Encounter (HOSPITAL_BASED_OUTPATIENT_CLINIC_OR_DEPARTMENT_OTHER)
Admission: RE | Disposition: A | Discharge status: Home / Self Care | Payer: Self-pay | Source: Home / Self Care | Attending: General Surgery

## 2019-01-20 DIAGNOSIS — Z880 Allergy status to penicillin: Secondary | ICD-10-CM | POA: Diagnosis not present

## 2019-01-20 DIAGNOSIS — Z79899 Other long term (current) drug therapy: Secondary | ICD-10-CM | POA: Diagnosis not present

## 2019-01-20 DIAGNOSIS — G8918 Other acute postprocedural pain: Secondary | ICD-10-CM | POA: Diagnosis not present

## 2019-01-20 DIAGNOSIS — K219 Gastro-esophageal reflux disease without esophagitis: Secondary | ICD-10-CM | POA: Insufficient documentation

## 2019-01-20 DIAGNOSIS — K4090 Unilateral inguinal hernia, without obstruction or gangrene, not specified as recurrent: Secondary | ICD-10-CM | POA: Insufficient documentation

## 2019-01-20 DIAGNOSIS — G4733 Obstructive sleep apnea (adult) (pediatric): Secondary | ICD-10-CM | POA: Diagnosis not present

## 2019-01-20 DIAGNOSIS — E039 Hypothyroidism, unspecified: Secondary | ICD-10-CM | POA: Diagnosis not present

## 2019-01-20 DIAGNOSIS — Z79890 Hormone replacement therapy (postmenopausal): Secondary | ICD-10-CM | POA: Insufficient documentation

## 2019-01-20 DIAGNOSIS — I10 Essential (primary) hypertension: Secondary | ICD-10-CM | POA: Insufficient documentation

## 2019-01-20 HISTORY — DX: Sleep apnea, unspecified: G47.30

## 2019-01-20 HISTORY — DX: Unilateral inguinal hernia, without obstruction or gangrene, not specified as recurrent: K40.90

## 2019-01-20 HISTORY — DX: Gastro-esophageal reflux disease without esophagitis: K21.9

## 2019-01-20 HISTORY — PX: INGUINAL HERNIA REPAIR: SHX194

## 2019-01-20 HISTORY — DX: Hypothyroidism, unspecified: E03.9

## 2019-01-20 HISTORY — DX: Chest pain, unspecified: R07.9

## 2019-01-20 HISTORY — DX: Personal history of urinary calculi: Z87.442

## 2019-01-20 SURGERY — REPAIR, HERNIA, INGUINAL, ADULT
Anesthesia: General | Site: Groin | Laterality: Left

## 2019-01-20 MED ORDER — BUPIVACAINE LIPOSOME 1.3 % IJ SUSP
INTRAMUSCULAR | Status: DC | PRN
  Administered 2019-01-20: 10 mL

## 2019-01-20 MED ORDER — ENSURE PRE-SURGERY PO LIQD
296.0000 mL | Freq: Once | ORAL | Status: DC
  Filled 2019-01-20: qty 296

## 2019-01-20 MED ORDER — IBUPROFEN 800 MG PO TABS: 800.0000 mg | ORAL_TABLET | Freq: Three times a day (TID) | ORAL | 0 refills | Status: DC | PRN

## 2019-01-20 MED ORDER — ACETAMINOPHEN 160 MG/5ML PO SOLN
325.0000 mg | Freq: Once | ORAL | Status: DC
  Filled 2019-01-20: qty 20.3

## 2019-01-20 MED ORDER — MIDAZOLAM HCL 2 MG/2ML IJ SOLN
INTRAMUSCULAR | Status: AC
  Filled 2019-01-20: qty 2

## 2019-01-20 MED ORDER — KETOROLAC TROMETHAMINE 15 MG/ML IJ SOLN
15.0000 mg | INTRAMUSCULAR | Status: AC
  Administered 2019-01-20: 15 mg via INTRAVENOUS
  Filled 2019-01-20: qty 1

## 2019-01-20 MED ORDER — OXYCODONE HCL 5 MG PO TABS
ORAL_TABLET | ORAL | Status: AC
  Filled 2019-01-20: qty 1

## 2019-01-20 MED ORDER — FENTANYL CITRATE (PF) 100 MCG/2ML IJ SOLN
INTRAMUSCULAR | Status: AC
  Filled 2019-01-20: qty 2

## 2019-01-20 MED ORDER — SUGAMMADEX SODIUM 200 MG/2ML IV SOLN
INTRAVENOUS | Status: DC | PRN
  Administered 2019-01-20: 200 mg via INTRAVENOUS

## 2019-01-20 MED ORDER — BUPIVACAINE HCL (PF) 0.5 % IJ SOLN
INTRAMUSCULAR | Status: DC | PRN
  Administered 2019-01-20: 30 mL

## 2019-01-20 MED ORDER — KETOROLAC TROMETHAMINE 30 MG/ML IJ SOLN
INTRAMUSCULAR | Status: AC
  Filled 2019-01-20: qty 1

## 2019-01-20 MED ORDER — LIDOCAINE 2% (20 MG/ML) 5 ML SYRINGE
INTRAMUSCULAR | Status: AC
  Filled 2019-01-20: qty 5

## 2019-01-20 MED ORDER — LIDOCAINE 2% (20 MG/ML) 5 ML SYRINGE
INTRAMUSCULAR | Status: DC | PRN
  Administered 2019-01-20 (×2): 50 mg via INTRAVENOUS

## 2019-01-20 MED ORDER — LACTATED RINGERS IV SOLN
INTRAVENOUS | Status: DC
  Administered 2019-01-20 (×2): via INTRAVENOUS
  Filled 2019-01-20: qty 1000

## 2019-01-20 MED ORDER — SUGAMMADEX SODIUM 200 MG/2ML IV SOLN
INTRAVENOUS | Status: AC
  Filled 2019-01-20: qty 2

## 2019-01-20 MED ORDER — DEXAMETHASONE SODIUM PHOSPHATE 10 MG/ML IJ SOLN
INTRAMUSCULAR | Status: DC | PRN
  Administered 2019-01-20: 8 mg via INTRAVENOUS

## 2019-01-20 MED ORDER — PROMETHAZINE HCL 25 MG/ML IJ SOLN
6.2500 mg | INTRAMUSCULAR | Status: DC | PRN
  Filled 2019-01-20: qty 1

## 2019-01-20 MED ORDER — OXYCODONE HCL 5 MG/5ML PO SOLN
5.0000 mg | Freq: Once | ORAL | Status: AC | PRN
  Filled 2019-01-20: qty 5

## 2019-01-20 MED ORDER — CHLORHEXIDINE GLUCONATE CLOTH 2 % EX PADS
6.0000 | MEDICATED_PAD | Freq: Once | CUTANEOUS | Status: DC
  Filled 2019-01-20: qty 6

## 2019-01-20 MED ORDER — MIDAZOLAM HCL 2 MG/2ML IJ SOLN
INTRAMUSCULAR | Status: DC | PRN
  Administered 2019-01-20: 2 mg via INTRAVENOUS

## 2019-01-20 MED ORDER — DEXAMETHASONE SODIUM PHOSPHATE 10 MG/ML IJ SOLN
INTRAMUSCULAR | Status: AC
  Filled 2019-01-20: qty 1

## 2019-01-20 MED ORDER — GABAPENTIN 300 MG PO CAPS
300.0000 mg | ORAL_CAPSULE | ORAL | Status: AC
  Administered 2019-01-20: 300 mg via ORAL
  Filled 2019-01-20: qty 1

## 2019-01-20 MED ORDER — PROPOFOL 10 MG/ML IV BOLUS
INTRAVENOUS | Status: AC
  Filled 2019-01-20: qty 20

## 2019-01-20 MED ORDER — ACETAMINOPHEN 500 MG PO TABS
1000.0000 mg | ORAL_TABLET | ORAL | Status: AC
  Administered 2019-01-20: 1000 mg via ORAL
  Filled 2019-01-20: qty 2

## 2019-01-20 MED ORDER — CLINDAMYCIN PHOSPHATE 900 MG/50ML IV SOLN
INTRAVENOUS | Status: AC
  Filled 2019-01-20: qty 50

## 2019-01-20 MED ORDER — LACTATED RINGERS IV SOLN
INTRAVENOUS | Status: DC
  Administered 2019-01-20: 12:00:00 via INTRAVENOUS
  Filled 2019-01-20: qty 1000

## 2019-01-20 MED ORDER — HYDROCODONE-ACETAMINOPHEN 5-325 MG PO TABS: 1.0000 | ORAL_TABLET | Freq: Four times a day (QID) | ORAL | 0 refills | Status: DC | PRN

## 2019-01-20 MED ORDER — OXYCODONE HCL 5 MG PO TABS
5.0000 mg | ORAL_TABLET | Freq: Once | ORAL | Status: AC | PRN
  Administered 2019-01-20: 5 mg via ORAL
  Filled 2019-01-20: qty 1

## 2019-01-20 MED ORDER — HYDROMORPHONE HCL 1 MG/ML IJ SOLN
0.2500 mg | INTRAMUSCULAR | Status: DC | PRN
  Administered 2019-01-20: 0.25 mg via INTRAVENOUS
  Administered 2019-01-20: 0.5 mg via INTRAVENOUS
  Administered 2019-01-20: 0.25 mg via INTRAVENOUS
  Filled 2019-01-20: qty 0.5

## 2019-01-20 MED ORDER — ONDANSETRON HCL 4 MG/2ML IJ SOLN
INTRAMUSCULAR | Status: DC | PRN
  Administered 2019-01-20: 4 mg via INTRAVENOUS

## 2019-01-20 MED ORDER — CLINDAMYCIN PHOSPHATE 900 MG/50ML IV SOLN
900.0000 mg | INTRAVENOUS | Status: AC
  Administered 2019-01-20: 900 mg via INTRAVENOUS
  Filled 2019-01-20: qty 50

## 2019-01-20 MED ORDER — FENTANYL CITRATE (PF) 100 MCG/2ML IJ SOLN
INTRAMUSCULAR | Status: DC | PRN
  Administered 2019-01-20: 100 ug via INTRAVENOUS

## 2019-01-20 MED ORDER — ROCURONIUM BROMIDE 10 MG/ML (PF) SYRINGE
PREFILLED_SYRINGE | INTRAVENOUS | Status: DC | PRN
  Administered 2019-01-20: 50 mg via INTRAVENOUS
  Administered 2019-01-20: 10 mg via INTRAVENOUS

## 2019-01-20 MED ORDER — GABAPENTIN 300 MG PO CAPS
ORAL_CAPSULE | ORAL | Status: AC
  Filled 2019-01-20: qty 1

## 2019-01-20 MED ORDER — HYDROMORPHONE HCL 1 MG/ML IJ SOLN
INTRAMUSCULAR | Status: AC
  Filled 2019-01-20: qty 1

## 2019-01-20 MED ORDER — ONDANSETRON HCL 4 MG/2ML IJ SOLN
INTRAMUSCULAR | Status: AC
  Filled 2019-01-20: qty 2

## 2019-01-20 MED ORDER — MEPERIDINE HCL 25 MG/ML IJ SOLN
6.2500 mg | INTRAMUSCULAR | Status: DC | PRN
  Filled 2019-01-20: qty 1

## 2019-01-20 MED ORDER — ACETAMINOPHEN 10 MG/ML IV SOLN
1000.0000 mg | Freq: Once | INTRAVENOUS | Status: DC | PRN
  Filled 2019-01-20: qty 100

## 2019-01-20 MED ORDER — ACETAMINOPHEN 325 MG PO TABS
325.0000 mg | ORAL_TABLET | Freq: Once | ORAL | Status: DC
  Filled 2019-01-20: qty 2

## 2019-01-20 MED ORDER — PROPOFOL 10 MG/ML IV BOLUS
INTRAVENOUS | Status: DC | PRN
  Administered 2019-01-20: 170 mg via INTRAVENOUS

## 2019-01-20 MED ORDER — ACETAMINOPHEN 500 MG PO TABS
ORAL_TABLET | ORAL | Status: AC
  Filled 2019-01-20: qty 2

## 2019-01-20 SURGICAL SUPPLY — 49 items
ADH SKN CLS APL DERMABOND .7 (GAUZE/BANDAGES/DRESSINGS) ×1
BLADE CLIPPER SENSICLIP SURGIC (BLADE) ×2 IMPLANT
BLADE HEX COATED 2.75 (ELECTRODE) ×2 IMPLANT
BLADE SURG 15 STRL LF DISP TIS (BLADE) ×1 IMPLANT
BLADE SURG 15 STRL SS (BLADE) ×2
CANISTER SUCT 3000ML PPV (MISCELLANEOUS) IMPLANT
CELLS DAT CNTRL 66122 CELL SVR (MISCELLANEOUS) IMPLANT
CHLORAPREP W/TINT 26ML (MISCELLANEOUS) ×2 IMPLANT
COVER BACK TABLE 60X90IN (DRAPES) ×2 IMPLANT
COVER MAYO STAND STRL (DRAPES) ×2 IMPLANT
COVER WAND RF STERILE (DRAPES) ×3 IMPLANT
DERMABOND ADVANCED (GAUZE/BANDAGES/DRESSINGS) ×1
DERMABOND ADVANCED .7 DNX12 (GAUZE/BANDAGES/DRESSINGS) ×1 IMPLANT
DRAIN PENROSE 18X1/4 LTX STRL (WOUND CARE) ×1 IMPLANT
DRAPE LAPAROSCOPIC ABDOMINAL (DRAPES) ×2 IMPLANT
DRAPE UTILITY XL STRL (DRAPES) ×2 IMPLANT
ELECT REM PT RETURN 9FT ADLT (ELECTROSURGICAL) ×2
ELECTRODE REM PT RTRN 9FT ADLT (ELECTROSURGICAL) ×1 IMPLANT
GLOVE BIOGEL PI IND STRL 7.0 (GLOVE) ×1 IMPLANT
GLOVE BIOGEL PI INDICATOR 7.0 (GLOVE) ×1
GLOVE SURG SS PI 7.0 STRL IVOR (GLOVE) ×2 IMPLANT
GOWN STRL REUS W/ TWL LRG LVL3 (GOWN DISPOSABLE) ×2 IMPLANT
GOWN STRL REUS W/TWL LRG LVL3 (GOWN DISPOSABLE) ×4
KIT TURNOVER CYSTO (KITS) ×2 IMPLANT
MESH BARD SOFT 3X6IN (Mesh General) ×1 IMPLANT
NDL HYPO 25X1 1.5 SAFETY (NEEDLE) ×1 IMPLANT
NEEDLE HYPO 25X1 1.5 SAFETY (NEEDLE) ×2 IMPLANT
NS IRRIG 500ML POUR BTL (IV SOLUTION) ×2 IMPLANT
PACK BASIN DAY SURGERY FS (CUSTOM PROCEDURE TRAY) ×2 IMPLANT
PAD ARMBOARD 7.5X6 YLW CONV (MISCELLANEOUS) ×1 IMPLANT
PENCIL BUTTON HOLSTER BLD 10FT (ELECTRODE) ×2 IMPLANT
RETRACTOR WND ALEXIS 18 MED (MISCELLANEOUS) IMPLANT
RTRCTR WOUND ALEXIS 18CM MED (MISCELLANEOUS)
SPONGE LAP 4X18 RFD (DISPOSABLE) IMPLANT
SUT MNCRL AB 4-0 PS2 18 (SUTURE) ×2 IMPLANT
SUT PROLENE 2 0 CT2 30 (SUTURE) ×4 IMPLANT
SUT SILK 3 0 TIES 17X18 (SUTURE)
SUT SILK 3-0 18XBRD TIE BLK (SUTURE) IMPLANT
SUT VIC AB 2-0 SH 27 (SUTURE) ×2
SUT VIC AB 2-0 SH 27XBRD (SUTURE) ×1 IMPLANT
SUT VIC AB 3-0 SH 27 (SUTURE) ×2
SUT VIC AB 3-0 SH 27X BRD (SUTURE) ×1 IMPLANT
SUT VICRYL 2 0 18  UND BR (SUTURE) ×1
SUT VICRYL 2 0 18 UND BR (SUTURE) ×1 IMPLANT
SYR BULB IRRIGATION 50ML (SYRINGE) ×2 IMPLANT
SYR CONTROL 10ML LL (SYRINGE) ×2 IMPLANT
TOWEL OR 17X26 10 PK STRL BLUE (TOWEL DISPOSABLE) ×2 IMPLANT
TUBE CONNECTING 12X1/4 (SUCTIONS) ×2 IMPLANT
YANKAUER SUCT BULB TIP NO VENT (SUCTIONS) ×2 IMPLANT

## 2019-01-20 NOTE — H&P (Signed)
Ryan Lam is an 43 y.o. male.   Chief Complaint: hernia HPI: 43 yo male with multiple month history of left groin pain and notable bulge. He denies nausea or vomiting. The pain has led him to be less active.  Past Medical History:  Diagnosis Date  . Chest pain 01/16/2019   to see dr [name] for 01-18-2019 or 01-19-2019  . GERD (gastroesophageal reflux disease)    hx of  . History of kidney stones   . Hypothyroidism   . Left inguinal hernia   . Sleep apnea    osa getting dental device    Past Surgical History:  Procedure Laterality Date  . chelazon removed from eye Left 2019  . LASIK  2008    Family History  Problem Relation Age of Onset  . Cancer Mother        hx breast  . COPD Father   . AAA (abdominal aortic aneurysm) Father   . Diabetes Maternal Grandmother   . Stroke Maternal Grandfather    Social History:  reports that he has never smoked. He has never used smokeless tobacco. He reports current alcohol use of about 10.0 standard drinks of alcohol per week. He reports that he does not use drugs.  Allergies:  Allergies  Allergen Reactions  . Penicillins     Childhood reaction hives    Medications Prior to Admission  Medication Sig Dispense Refill  . Cholecalciferol ([employer] VITAMIN D) 2000 units CAPS Take 5,000 Units by mouth 2 (two) times daily.     Marland Kitchen levothyroxine (SYNTHROID, LEVOTHROID) 88 MCG tablet Take 1 tablet (88 mcg total) by mouth daily. 90 tablet 1  . Nutritional Supplements (JUICE PLUS FIBRE PO) Take by mouth.    . Zinc 25 MG TABS Take 50 mg by mouth daily.       No results found for this or any previous visit (from the past 48 hour(s)). No results found.  Review of Systems  Constitutional: Negative for chills and fever.  HENT: Negative for hearing loss.   Eyes: Negative for blurred vision and double vision.  Respiratory: Negative for cough and hemoptysis.   Cardiovascular: Negative for chest pain and palpitations.  Gastrointestinal: Positive for  abdominal pain. Negative for nausea and vomiting.  Genitourinary: Negative for dysuria and urgency.  Musculoskeletal: Negative for myalgias and neck pain.  Skin: Negative for itching and rash.  Neurological: Negative for dizziness, tingling and headaches.  Endo/Heme/Allergies: Does not bruise/bleed easily.  Psychiatric/Behavioral: Negative for depression and suicidal ideas.    Blood pressure (!) 153/107, pulse 82, temperature 97.8 F (36.6 C), temperature source Oral, resp. rate 18, height 5\' 11"  (1.803 m), weight 82 kg, SpO2 100 %. Physical Exam  Vitals reviewed. Constitutional: He is oriented to person, place, and time. He appears well-developed and well-nourished.  HENT:  Head: Normocephalic and atraumatic.  Eyes: Pupils are equal, round, and reactive to light. Conjunctivae and EOM are normal.  Neck: Normal range of motion. Neck supple.  Cardiovascular: Normal rate and regular rhythm.  Respiratory: Effort normal and breath sounds normal.  GI: Soft. Bowel sounds are normal. He exhibits no distension. There is no abdominal tenderness.  Left inguinal hernia  Musculoskeletal: Normal range of motion.  Neurological: He is alert and oriented to person, place, and time.  Skin: Skin is warm and dry.  Psychiatric: He has a normal mood and affect. His behavior is normal.     Assessment/Plan 43 yo male with symptomatic left inguinal hernia -open left inguinal  hernia repair with mesh -planned outpatient procedure  Rodman Pickle, MD 01/20/2019, 9:20 AM

## 2019-01-20 NOTE — Anesthesia Preprocedure Evaluation (Addendum)
Anesthesia Evaluation  Patient identified by MRN, date of birth, ID band Patient awake    Reviewed: Allergy & Precautions, NPO status , Patient's Chart, lab work & pertinent test results  Airway Mallampati: I  TM Distance: >3 FB Neck ROM: Full    Dental  (+) Teeth Intact, Dental Advisory Given   Pulmonary sleep apnea ,    breath sounds clear to auscultation       Cardiovascular hypertension,  Rhythm:Regular Rate:Normal     Neuro/Psych negative neurological ROS  negative psych ROS   GI/Hepatic Neg liver ROS, GERD  ,  Endo/Other  Hypothyroidism   Renal/GU      Musculoskeletal negative musculoskeletal ROS (+)   Abdominal Normal abdominal exam  (+)   Peds  Hematology negative hematology ROS (+)   Anesthesia Other Findings   Reproductive/Obstetrics                            Anesthesia Physical Anesthesia Plan  ASA: II  Anesthesia Plan: General   Post-op Pain Management: GA combined w/ Regional for post-op pain   Induction: Intravenous  PONV Risk Score and Plan: 3 and Ondansetron, Dexamethasone and Midazolam  Airway Management Planned: Oral ETT  Additional Equipment: None  Intra-op Plan:   Post-operative Plan: Extubation in OR  Informed Consent: I have reviewed the patients History and Physical, chart, labs and discussed the procedure including the risks, benefits and alternatives for the proposed anesthesia with the patient or authorized representative who has indicated his/her understanding and acceptance.     Dental advisory given  Plan Discussed with: CRNA  Anesthesia Plan Comments:        Anesthesia Quick Evaluation

## 2019-01-20 NOTE — Anesthesia Postprocedure Evaluation (Signed)
Anesthesia Post Note  Patient: Ryan Lam  Procedure(s) Performed: LEFT OPEN HERNIA REPAIR INGUINAL  WITH MESH (Left Groin)     Patient location during evaluation: PACU Anesthesia Type: General Level of consciousness: awake and alert Pain management: pain level controlled Vital Signs Assessment: post-procedure vital signs reviewed and stable Respiratory status: spontaneous breathing, nonlabored ventilation, respiratory function stable and patient connected to nasal cannula oxygen Cardiovascular status: blood pressure returned to baseline and stable Postop Assessment: no apparent nausea or vomiting Anesthetic complications: no    Last Vitals:  Vitals:   01/20/19 1215 01/20/19 1256  BP: (!) 136/97 (!) 132/96  Pulse: 82 80  Resp: (!) 0 16  Temp:  36.6 C  SpO2: 96% 100%                  Shelton Silvas

## 2019-01-20 NOTE — Anesthesia Procedure Notes (Signed)
Anesthesia Regional Block: TAP block   Pre-Anesthetic Checklist: ,, timeout performed, Correct Patient, Correct Site, Correct Laterality, Correct Procedure, Correct Position, site marked, Risks and benefits discussed,  Surgical consent,  Pre-op evaluation,  At surgeon's request and post-op pain management  Laterality: Left  Prep: chloraprep       Needles:  Injection technique: Single-shot  Needle Type: Echogenic Stimulator Needle     Needle Length: 9cm  Needle Gauge: 21     Additional Needles:   Procedures:,,,, ultrasound used (permanent image in chart),,,,  Narrative:  Start time: 01/20/2019 8:50 AM End time: 01/20/2019 9:00 AM Injection made incrementally with aspirations every 5 mL.  Performed by: Personally  Anesthesiologist: Shelton Silvas, MD  Additional Notes: Patient tolerated the procedure well. Local anesthetic introduced in an incremental fashion under minimal resistance after negative aspirations. No paresthesias were elicited. After completion of the procedure, no acute issues were identified and patient continued to be monitored by RN.

## 2019-01-20 NOTE — Anesthesia Procedure Notes (Signed)
Procedure Name: Intubation Date/Time: 01/20/2019 9:40 AM Performed by: Effie Berkshire, MD Pre-anesthesia Checklist: Patient identified, Emergency Drugs available, Suction available, Timeout performed and Patient being monitored Patient Re-evaluated:Patient Re-evaluated prior to induction Oxygen Delivery Method: Circle system utilized Preoxygenation: Pre-oxygenation with 100% oxygen Induction Type: IV induction Ventilation: Mask ventilation without difficulty Laryngoscope Size: Mac and 4 Tube type: Oral Tube size: 7.5 mm Number of attempts: 1 Airway Equipment and Method: Stylet Placement Confirmation: breath sounds checked- equal and bilateral,  CO2 detector,  positive ETCO2 and ETT inserted through vocal cords under direct vision Secured at: 22 cm Dental Injury: Teeth and Oropharynx as per pre-operative assessment

## 2019-01-20 NOTE — Discharge Instructions (Signed)
°  Post Anesthesia Home Care Instructions  Activity: Get plenty of rest for the remainder of the day. A responsible adult should stay with you for 24 hours following the procedure.  For the next 24 hours, DO NOT: -Drive a car -Advertising copywriter -Drink alcoholic beverages -Take any medication unless instructed by your physician -Make any legal decisions or sign important papers.  Meals: Start with liquid foods such as gelatin or soup. Progress to regular foods as tolerated. Avoid greasy, spicy, heavy foods. If nausea and/or vomiting occur, drink only clear liquids until the nausea and/or vomiting subsides. Call your physician if vomiting continues.  Special Instructions/Symptoms: Your throat may feel dry or sore from the anesthesia or the breathing tube placed in your throat during surgery. If this causes discomfort, gargle with warm salt water. The discomfort should disappear within 24 hours.  If you had a scopolamine patch placed behind your ear for the management of post- operative nausea and/or vomiting:  1. The medication in the patch is effective for 72 hours, after which it should be removed.  Wrap patch in a tissue and discard in the trash. Wash hands thoroughly with soap and water. 2. You may remove the patch earlier than 72 hours if you experience unpleasant side effects which may include dry mouth, dizziness or visual disturbances. 3. Avoid touching the patch. Wash your hands with soap and water after contact with the patch.  No advil, aleve, motrin, ibuprofen until 2:15 today    Information for Discharge Teaching: EXPAREL (bupivacaine liposome injectable suspension)   Your surgeon gave you EXPAREL(bupivacaine) in your surgical incision to help control your pain after surgery.   EXPAREL is a local anesthetic that provides pain relief by numbing the tissue around the surgical site.  EXPAREL is designed to release pain medication over time and can control pain for up to 72  hours.  Depending on how you respond to EXPAREL, you may require less pain medication during your recovery.  Possible side effects:  Temporary loss of sensation or ability to move in the area where bupivacaine was injected.  Nausea, vomiting, constipation  Rarely, numbness and tingling in your mouth or lips, lightheadedness, or anxiety may occur.  Call your doctor right away if you think you may be experiencing any of these sensations, or if you have other questions regarding possible side effects.  Follow all other discharge instructions given to you by your surgeon or nurse. Eat a healthy diet and drink plenty of water or other fluids.  If you return to the hospital for any reason within 96 hours following the administration of EXPAREL, please inform your health care providers.

## 2019-01-20 NOTE — Transfer of Care (Signed)
   Last Vitals:  Vitals Value Taken Time  BP 131/88 01/20/2019 11:00 AM  Temp 36.9 C 01/20/2019 10:57 AM  Pulse 91 01/20/2019 11:01 AM  Resp 15 01/20/2019 11:01 AM  SpO2 100 % 01/20/2019 11:01 AM  Vitals shown include unvalidated device data.  Last Pain:  Vitals:   01/20/19 1057  TempSrc:   PainSc: 0-No pain      Patients Stated Pain Goal: 5 (01/20/19 0818)  Immediate Anesthesia Transfer of Care Note  Patient: Ryan Lam  Procedure(s) Performed: Procedure(s) (LRB): LEFT OPEN HERNIA REPAIR INGUINAL  WITH MESH (Left)  Patient Location: PACU  Anesthesia Type: General  Level of Consciousness: awake, alert  and oriented  Airway & Oxygen Therapy: Patient Spontanous Breathing and Patient connected to nasal cannula oxygen  Post-op Assessment: Report given to PACU RN and Post -op Vital signs reviewed and stable  Post vital signs: Reviewed and stable  Complications: No apparent anesthesia complications

## 2019-01-20 NOTE — Op Note (Signed)
Preop diagnosis: left inguinal hernia  Postop diagnosis: left inguinal hernia  Procedure: open Left inguinal hernia repair with mesh  Surgeon: Feliciana Rossetti, M.D.  Asst: none  Anesthesia: Gen.   Indications for procedure: Ryan Lam is a 43 y.o. male with symptoms of pain and enlarging Left inguinal hernia(s). After discussing risks, alternatives and benefits he decided on open repair and was brought to day surgery for repair.  Description of procedure: The patient was brought into the operative suite, placed supine. Anesthesia was administered with endotracheal tube. Patient was strapped in place. The patient was prepped and draped in the usual sterile fashion.  The anterior superior iliac spine and pubic tubercle were identified on the Left side. An incision was made 1cm above the connecting line, representative of the location of the inguinal ligament. The subcutaneous tissue was bluntly dissected, scarpa's fascia was dissected away. The external abdominal oblique fascia was identified and sharply opened down to the external inguinal ring. The conjoint tendon and inguinal ligament were identified. The cord structures and sac were dissected free of the surrounding tissue in 360 degrees. A penrose drain was used to encircle the contents. The cremasteric fibers were dissected free of the contents of the cord and hernia sac. The cord structures (vessels and vas deferens) were identified and carefully dissected away from the hernia sac. The hernia sac was dissected down to the internal inguinal ring. Preperitoneal fat was identified showing appropriate dissection. There was a lipoma of the cord which was removed. The sac was small along the lipoma. The sac was then reduced into the preperitoneal space. A 3x6 Bard Soft mesh was then used to close the defect and reinforce the floor. The mesh was sutured to the lacunar ligament and inguinal ligament using a 2-0 prolene in running fashion. Next the  superior edge of the mesh was sutured to the conjoined tendon using a 2-0 running Prolene. An additional 2-0 Prolene was used to suture the tail ends of the mesh together re-creating the deep ring. Cord structures are running in a neutral position through the mesh. Next the external abdominal oblique fascia was closed with a 2-0 Vicryl in running fashion to re-create the external inguinal ring. Scarpa's fascia was closed with 3-0 Vicryl in running fashion. Skin was closed with a 4-0 Monocryl subcuticular stitch in running fashion. Dermabond place for dressing. Patient woke from anesthesia and brought to PACU in stable condition. All counts are correct.  Findings: left open inguinal hernia  Specimen: none  Blood loss: 30 ml  Local anesthesia: preoperative block  Complications: none  Implant: 3x6in Bard soft mesh  Feliciana Rossetti, M.D. General, Bariatric, & Minimally Invasive Surgery [facility], [place] 10:42 AM 01/20/2019

## 2019-01-21 ENCOUNTER — Encounter (HOSPITAL_BASED_OUTPATIENT_CLINIC_OR_DEPARTMENT_OTHER): Payer: Self-pay | Admitting: General Surgery

## 2019-01-25 IMAGING — DX DG FOOT 2V*L*
2 series · 2 of 2 positions shown · non-contrast
Comparison: None.

CLINICAL DATA: Chronic left foot pain since trauma in Saturday March, 2017.

EXAM:
LEFT FOOT - 2 VIEW

[foot dp]
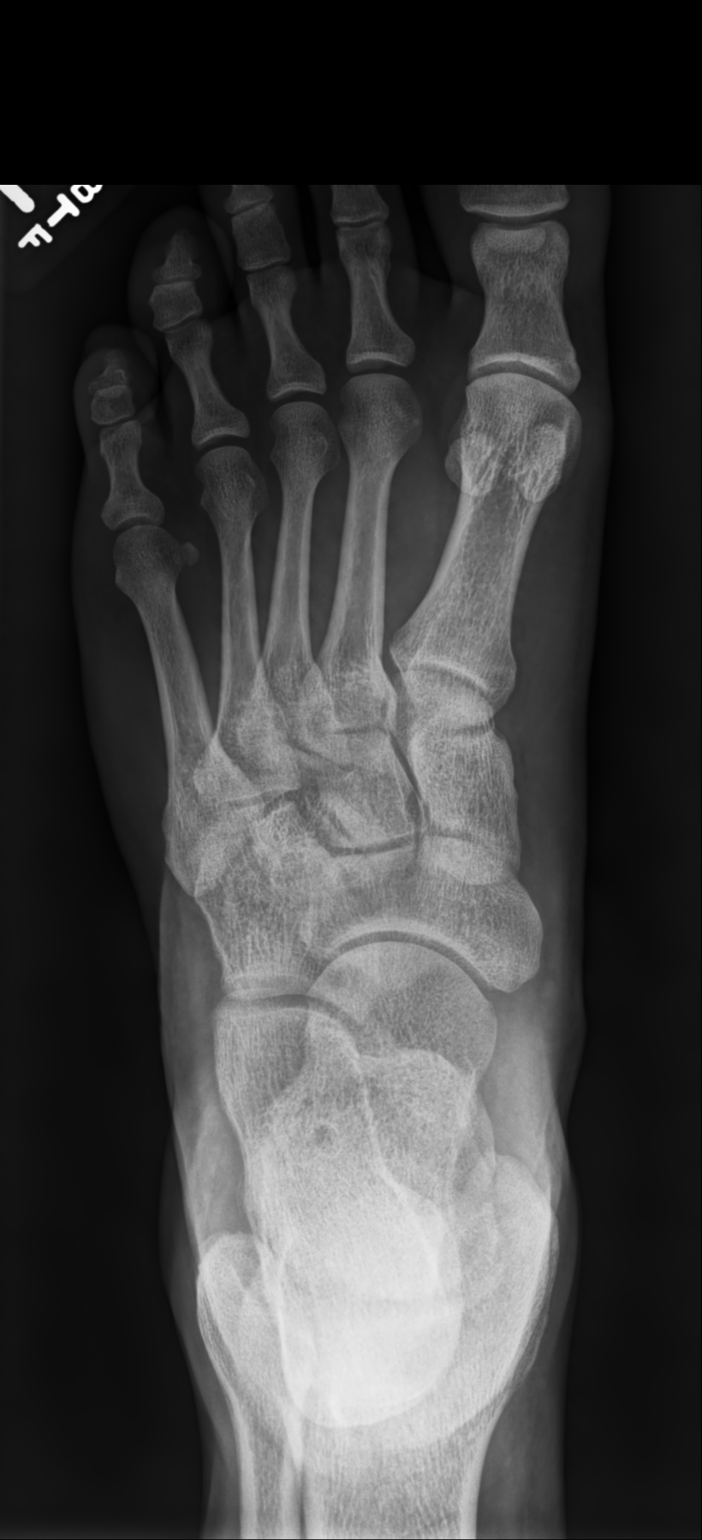

[foot lat]
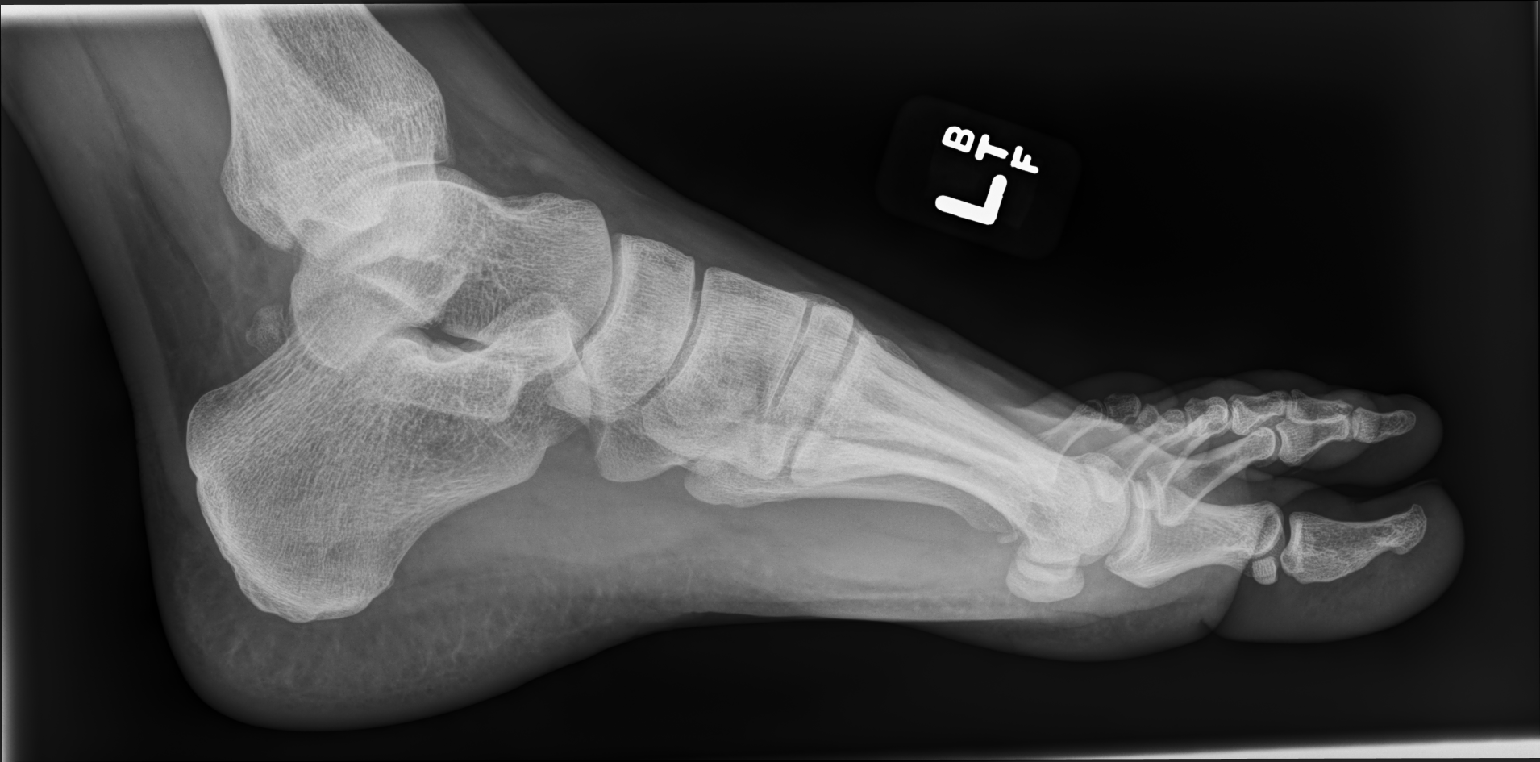

[2 of 2 positions shown; findings below may reference images not displayed]

FINDINGS: There is no evidence of fracture or dislocation. There is no
evidence of arthropathy or other focal bone abnormality. Soft
tissues are unremarkable.
IMPRESSION: Negative.

## 2019-03-02 ENCOUNTER — Telehealth: Payer: Self-pay | Admitting: Family Medicine

## 2019-03-02 NOTE — Telephone Encounter (Signed)
Pt has not been seen in over a year. Called pt and left vm to schedule f.u  °

## 2019-03-19 ENCOUNTER — Ambulatory Visit (INDEPENDENT_AMBULATORY_CARE_PROVIDER_SITE_OTHER): Payer: BLUE CROSS/BLUE SHIELD | Admitting: Family Medicine

## 2019-03-19 ENCOUNTER — Telehealth: Payer: Self-pay

## 2019-03-19 ENCOUNTER — Encounter: Payer: Self-pay | Admitting: Family Medicine

## 2019-03-19 DIAGNOSIS — R0789 Other chest pain: Secondary | ICD-10-CM | POA: Diagnosis not present

## 2019-03-19 DIAGNOSIS — E039 Hypothyroidism, unspecified: Secondary | ICD-10-CM

## 2019-03-19 MED ORDER — LEVOTHYROXINE SODIUM 88 MCG PO TABS: 88.0000 ug | ORAL_TABLET | Freq: Every day | ORAL | 1 refills | Status: DC

## 2019-03-19 MED ORDER — DICLOFENAC SODIUM 75 MG PO TBEC: 75.0000 mg | DELAYED_RELEASE_TABLET | Freq: Two times a day (BID) | ORAL | 0 refills | Status: DC

## 2019-03-19 NOTE — Assessment & Plan Note (Signed)
Last TSH significantly elevated.  Continue Synthroid 88 mcg daily.  Advised him to follow-up in the next 1 to 2 weeks to recheck TSH and free T4.

## 2019-03-19 NOTE — Telephone Encounter (Signed)
Left voice message for patient to call clinic.Regarding chest pain need to be triaged .

## 2019-03-19 NOTE — Telephone Encounter (Signed)
Patient stated he is having soreness in right side of chest near breast bone.Patient only feels the soreness with touch.Symptoms began 2 months ago.No headeaches,no chest pain. Appt scheduled.

## 2019-03-19 NOTE — Progress Notes (Signed)
    Chief Complaint:  Ryan Lam is a 43 y.o. male who presents today for a virtual office visit with a chief complaint of chest wall pain.   Assessment/Plan:  Chest Wall Pain No red flags.  Consistent with costochondritis.  Start diclofenac 75 mg twice daily for the next 1 to 2 weeks.  Discussed activities to avoid.  Discussed reasons to return to care and seek emergent care.  Hypothyroidism Last TSH significantly elevated.  Continue Synthroid 88 mcg daily.  Advised him to follow-up in the next 1 to 2 weeks to recheck TSH and free T4.    Subjective:  HPI:  Chest Wall Pain  Symptoms started 1 to 2 months ago.  Located in right lower chest wall.  No pain at rest.  Only hurts when pushing on the area.  No masses.  No rashes or skin changes.  No shortness of breath.  Has no symptoms with exertion.  No obvious precipitating events.  No specific treatments tried.  No other obvious alleviating or aggravating factors.  Hypothyroidism On Synthroid 88 mcg daily.  Has had some worsening fatigue the last few weeks.  ROS: Per HPI  PMH: He reports that he has never smoked. He has never used smokeless tobacco. He reports current alcohol use of about 10.0 standard drinks of alcohol per week. He reports that he does not use drugs.      Objective/Observations  Physical Exam: Gen: NAD, resting comfortably Chest: No deformities noted. Pulm: Normal work of breathing Neuro: Grossly normal, moves all extremities Psych: Normal affect and thought content   Virtual Visit via Video   I connected with [name] on 03/19/19 at  2:20 PM EDT by a video enabled telemedicine application and verified that I am speaking with the correct person using two identifiers. I discussed the limitations of evaluation and management by telemedicine and the availability of in person appointments. The patient expressed understanding and agreed to proceed.   Patient location: Private Office Provider location:  [place] [employer] Persons participating in the virtual visit: Myself and Patient     [name]. [name], MD 03/19/2019 2:51 PM

## 2019-04-22 ENCOUNTER — Encounter: Payer: Self-pay | Admitting: Family Medicine

## 2019-04-22 NOTE — Telephone Encounter (Signed)
Pt has appointment for in office visit 04/27/19.

## 2019-04-27 ENCOUNTER — Encounter: Payer: Self-pay | Admitting: Family Medicine

## 2019-04-27 ENCOUNTER — Other Ambulatory Visit: Payer: Self-pay

## 2019-04-27 ENCOUNTER — Ambulatory Visit (INDEPENDENT_AMBULATORY_CARE_PROVIDER_SITE_OTHER): Payer: BC Managed Care – PPO | Admitting: Family Medicine

## 2019-04-27 VITALS — BP 110/78 | HR 72 | Temp 98.1°F | Ht 71.0 in | Wt 182.0 lb

## 2019-04-27 DIAGNOSIS — S6010XA Contusion of unspecified finger with damage to nail, initial encounter: Secondary | ICD-10-CM | POA: Diagnosis not present

## 2019-04-27 DIAGNOSIS — R079 Chest pain, unspecified: Secondary | ICD-10-CM

## 2019-04-27 DIAGNOSIS — L821 Other seborrheic keratosis: Secondary | ICD-10-CM

## 2019-04-27 DIAGNOSIS — L720 Epidermal cyst: Secondary | ICD-10-CM

## 2019-04-27 MED ORDER — LIDOCAINE 5 % EX PTCH: 1.0000 | MEDICATED_PATCH | CUTANEOUS | 0 refills | Status: DC

## 2019-04-27 NOTE — Patient Instructions (Signed)
It was very nice to see you today!  Your chest wall pain is due to inflamed cartilage.  You can try using lidocaine patches.  Please let me know if the spots on your legs change at all.  Your nail should heal over the next several months.  Let me know if your symptoms do not continue to improve over the next several months.  Take care, Dr Jerline Pain

## 2019-04-27 NOTE — Progress Notes (Signed)
   Chief Complaint:  Ryan Lam is a 43 y.o. male who presents today with a chief complaint of chest wall pain.   Assessment/Plan:  Chest Wall Pain No red flags.  Consistent with costochondritis.  Recommended lidocaine patches.  Offered trigger point injection however patient declined.  Discussed reasons to return to care and seek emergent care.  Follow-up as needed.  Seborrheic keratosis No signs of malignancy.  Reassured patient.  Discussed warning signs.  Continue with watchful waiting.  Epidermal cyst Reassured patient.  Discussed warning signs and reasons to return to care.  Continue with watchful waiting.  Subungual hematoma No red flags.  Neurovascular intact distally.  Not amenable to trephination given that hematoma has already coagulated and injury occurred several days ago.  Discussed supportive measures and reasons to return to care.  No problem-specific Assessment & Plan notes found for this encounter.     Subjective:  HPI:  Chest Wall Pain  Started several months ago.  Had virtual visit about a month ago and was diagnosed with costochondritis.  He was started on diclofenac 75 mg twice daily.  Hurts when pushing on the area. No symptoms otherwise. No masses.  No shortness of breath.  No exertional symptoms. Got a little better with diclofenac but has come back since then. No other treatments tried.   He is also noticed a few spots on his lower legs that have changed in color recently.  They have not changed in size.  They are not painful.  No drainage.  He also smashed the middle finger on his left hand several days ago and now his left nail has turned black.  No longer has any pain to the area.  ROS: Per HPI  PMH: He reports that he has never smoked. He has never used smokeless tobacco. He reports current alcohol use of about 10.0 standard drinks of alcohol per week. He reports that he does not use drugs.      Objective:  Physical Exam: BP 110/78 (BP  Location: Left Arm, Patient Position: Sitting, Cuff Size: Normal)   Pulse 72   Temp 98.1 F (36.7 C) (Oral)   Ht 5\' 11"  (1.803 m)   Wt 182 lb (82.6 kg)   SpO2 97%   BMI 25.38 kg/m   Gen: NAD, resting comfortably CV: Regular rate and rhythm with no murmurs appreciated Pulm: Normal work of breathing, clear to auscultation bilaterally with no crackles, wheezes, or rhonchi Chest: No deformities.  Tender to palpation along the lower costal border. Skin: Left lower extremity with 4 mm waxy lesion with well demarcated borders.  Right lower extremity with 2 erythematous nodules on right anterior shin. Neuro: Grossly normal, moves all extremities Psych: Normal affect and thought content  EKG: NSR, no ischemic changes.      [name]. Jerline Pain, MD 04/27/2019 1:58 PM

## 2019-05-28 ENCOUNTER — Encounter: Payer: Self-pay | Admitting: Family Medicine

## 2019-05-28 ENCOUNTER — Ambulatory Visit (INDEPENDENT_AMBULATORY_CARE_PROVIDER_SITE_OTHER): Payer: BC Managed Care – PPO | Admitting: Adult Health

## 2019-05-28 ENCOUNTER — Other Ambulatory Visit: Payer: Self-pay

## 2019-05-28 ENCOUNTER — Encounter: Payer: Self-pay | Admitting: Adult Health

## 2019-05-28 DIAGNOSIS — J0100 Acute maxillary sinusitis, unspecified: Secondary | ICD-10-CM | POA: Diagnosis not present

## 2019-05-28 DIAGNOSIS — M79645 Pain in left finger(s): Secondary | ICD-10-CM

## 2019-05-28 MED ORDER — AZITHROMYCIN 250 MG PO TABS: ORAL_TABLET | ORAL | 0 refills | Status: DC

## 2019-05-28 NOTE — Progress Notes (Signed)
Virtual Visit via Video Note  I connected with Ryan Lam on 05/28/19 at  4:00 PM EDT by a video enabled telemedicine application and verified that I am speaking with the correct person using two identifiers.  Location patient: home Location provider:work or home office Persons participating in the virtual visit: patient, provider  I discussed the limitations of evaluation and management by telemedicine and the availability of in person appointments. The patient expressed understanding and agreed to proceed.   HPI:  43 year old male who is being evaluated today for two acute complaints.   1.  Reports nine days of nasal congestion, rhinorrhea,  with semi-productive cough, sinus pain and pressure, and the feeling of his right ear stopped up.  Symptoms have been waxing and waning seem to be better when he takes an over-the-counter decongestant.  He denies fevers or chills and has not had any shortness of breath or wheezing.  His two young daughters have symptoms of rhinorrhea  2.  Left thumb pain x 3 weeks.  It is located in the DIP joint and at the base of the left thumb.  Pain is described as "aching" and is only with hyperextension.  He has full range of motion and denies any popping sensation.  Has not noticed any swelling or bruising.  Aggravating factor could have been he was camping and got up in the morning he pushed off the ground if he hyperextended his left thumb.  Has not been using any over-the-counter remedies or icing.  ROS: See pertinent positives and negatives per HPI.  Past Medical History:  Diagnosis Date  . Chest pain 01/16/2019   to see dr Jerline Pain for 01-18-2019 or 01-19-2019  . GERD (gastroesophageal reflux disease)    hx of  . History of kidney stones   . Hypothyroidism   . Left inguinal hernia   . Sleep apnea    osa getting dental device    Past Surgical History:  Procedure Laterality Date  . chelazon removed from eye Left 2019  . INGUINAL HERNIA REPAIR Left  01/20/2019   Procedure: LEFT OPEN HERNIA REPAIR INGUINAL  WITH MESH;  Surgeon: Kinsinger, Arta Bruce, MD;  Location: [facility];  Service: General;  Laterality: Left;  . LASIK  2008    Family History  Problem Relation Age of Onset  . Cancer Mother        hx breast  . COPD Father   . AAA (abdominal aortic aneurysm) Father   . Diabetes Maternal Grandmother   . Stroke Maternal Grandfather       Current Outpatient Medications:  .  Cholecalciferol ([employer] VITAMIN D) 2000 units CAPS, Take 5,000 Units by mouth 2 (two) times daily. , Disp: , Rfl:  .  levothyroxine (SYNTHROID) 88 MCG tablet, Take 1 tablet (88 mcg total) by mouth daily., Disp: 90 tablet, Rfl: 1 .  lidocaine (LIDODERM) 5 %, Place 1 patch onto the skin daily. Remove & Discard patch within 12 hours or as directed by MD, Disp: 30 patch, Rfl: 0 .  Nutritional Supplements (JUICE PLUS FIBRE PO), Take by mouth., Disp: , Rfl:  .  Zinc 25 MG TABS, Take 50 mg by mouth daily. , Disp: , Rfl:   EXAM:  VITALS per patient if applicable:  GENERAL: alert, oriented, appears well and in no acute distress  HEENT: atraumatic, conjunttiva clear, no obvious abnormalities on inspection of external nose and ears  NECK: normal movements of the head and neck  LUNGS: on inspection no signs  of respiratory distress, breathing rate appears normal, no obvious gross SOB, gasping or wheezing  CV: no obvious cyanosis  MS: moves all visible extremities without noticeable abnormality  PSYCH/NEURO: pleasant and cooperative, no obvious depression or anxiety, speech and thought processing grossly intact  ASSESSMENT AND PLAN:  Discussed the following assessment and plan:  1. Acute non-recurrent maxillary sinusitis - Will treat due to symptoms and time frame. Can also use Flonase.  - azithromycin (ZITHROMAX Z-PAK) 250 MG tablet; Take 2 tablets on Day 1.  Then take 1 tablet daily.  Dispense: 6 tablet; Refill: 0  2. Thumb pain, left  -Likely  hyperextension injury. -Advised to ice and rest over the weekend -Can follow-up with PCP if no improvement   I discussed the assessment and treatment plan with the patient. The patient was provided an opportunity to ask questions and all were answered. The patient agreed with the plan and demonstrated an understanding of the instructions.   The patient was advised to call back or seek an in-person evaluation if the symptoms worsen or if the condition fails to improve as anticipated.   Shirline Freesory Oliverio Cho, NP

## 2019-07-01 ENCOUNTER — Encounter: Payer: Self-pay | Admitting: Family Medicine

## 2019-07-01 ENCOUNTER — Other Ambulatory Visit: Payer: Self-pay | Admitting: Family Medicine

## 2019-07-01 ENCOUNTER — Ambulatory Visit: Payer: BC Managed Care – PPO | Admitting: Family Medicine

## 2019-07-01 ENCOUNTER — Other Ambulatory Visit: Payer: Self-pay

## 2019-07-01 VITALS — BP 124/84 | HR 73 | Temp 98.2°F | Ht 71.0 in | Wt 187.0 lb

## 2019-07-01 DIAGNOSIS — N5089 Other specified disorders of the male genital organs: Secondary | ICD-10-CM

## 2019-07-01 DIAGNOSIS — M79645 Pain in left finger(s): Secondary | ICD-10-CM | POA: Diagnosis not present

## 2019-07-01 DIAGNOSIS — M7711 Lateral epicondylitis, right elbow: Secondary | ICD-10-CM

## 2019-07-01 DIAGNOSIS — Z6826 Body mass index (BMI) 26.0-26.9, adult: Secondary | ICD-10-CM | POA: Diagnosis not present

## 2019-07-01 DIAGNOSIS — E663 Overweight: Secondary | ICD-10-CM

## 2019-07-01 MED ORDER — DICLOFENAC SODIUM 1.5 % TD SOLN: TRANSDERMAL | 3 refills | Status: DC

## 2019-07-01 NOTE — Patient Instructions (Signed)
It was very nice to see you today!  Please try using Pennsaid for your elbow and thumb.  Let me know if not improving.  We will get an ultrasound of your testicles.  We may need to send you to a urologist depending on results.  Take care, Dr Jerline Pain  Please try these tips to maintain a healthy lifestyle:   Eat at least 3 REAL meals and 1-2 snacks per day.  Aim for no more than 5 hours between eating.  If you eat breakfast, please do so within one hour of getting up.    Obtain twice as many fruits/vegetables as protein or carbohydrate foods for both lunch and dinner. (Half of each meal should be fruits/vegetables, one quarter protein, and one quarter starchy carbs)   Cut down on sweet beverages. This includes juice, soda, and sweet tea.    Exercise at least 150 minutes every week.

## 2019-07-01 NOTE — Progress Notes (Addendum)
   Chief Complaint:  Ryan Lam is a 43 y.o. male who presents for same day appointment with a chief complaint of testicular pain.   Assessment/Plan:  Testicular Pain Check ultrasound.  Will likely need referral to urology depending on results.  Lateral epicondylitis Start topical Pennsaid.  Consider trial of oral anti-inflammatories if no improvement.  Left thumb pain Likely mild tendinitis.  Start Pennsaid as noted above.  If no improvement may need referral to sports medicine.  Body mass index is 26.08 kg/m. / Overweight BMI Metric Follow Up - 07/01/19 0951      BMI Metric Follow Up-Please document annually   BMI Metric Follow Up  Education provided          Subjective:  HPI:  Testicular Pain, acute problem Started about a month ago.  Pain located in bottom of left testicle.  Painful to touch.  Associated with some swelling.  Area seems to be increasing in size.  No obvious injuries or precipitating events.  Had something similar about 6 months ago that resolved on its own after his hernia surgery.   Elbow Pain Started a few weeks ago. Located in in right lateral elbow. No treatments tried.  Thinks that he may have injured it while camping.  Symptoms seem to be improving.  Left thumb pain and stiffness Also started a few weeks ago.  May have happened while camping.  Has difficulty with extending his thumb fully.  Some pain with use.  No treatments tried.  No other obvious alleviating or aggravating factors.  ROS: Per HPI  PMH: He reports that he has never smoked. He has never used smokeless tobacco. He reports current alcohol use of about 10.0 standard drinks of alcohol per week. He reports that he does not use drugs.      Objective:  Physical Exam: BP 124/84 (BP Location: Left Arm, Patient Position: Sitting, Cuff Size: Normal)   Pulse 73   Temp 98.2 F (36.8 C) (Oral)   Ht 5\' 11"  (1.803 m)   Wt 187 lb (84.8 kg)   SpO2 98%   BMI 26.08 kg/m   Gen: NAD,  resting comfortably MSK: Right elbow without deformities.  Mildly tender to palpation along lateral epicondyles.  No pain with resisted extension of wrist or fingers.  Left thumb with limited extension at interphalangeal joint.  Pain elicited with passive range of motion at interphalangeal joint. GU: Left testicle grossly larger than right testicle.  Mildly painful to palpation.     [name]. Jerline Pain, MD 07/01/2019 9:45 AM

## 2019-07-13 ENCOUNTER — Ambulatory Visit
Admission: RE | Admit: 2019-07-13 | Discharge: 2019-07-13 | Disposition: A | Discharge status: Home / Self Care | Payer: BC Managed Care – PPO | Source: Ambulatory Visit | Attending: Family Medicine | Admitting: Family Medicine

## 2019-07-13 DIAGNOSIS — N5089 Other specified disorders of the male genital organs: Secondary | ICD-10-CM | POA: Diagnosis not present

## 2019-07-14 NOTE — Progress Notes (Signed)
Please inform patient of the following:  Ultrasound showed that he has no testicular abnormalities. The area of pain he is having is likely a small cyst in the skin. We can place referral to dermatology for excision if it is still bothersome. We can also give a few more weeks to see if it resolves on its on.  Ryan Lam. Jerline Pain, MD 07/14/2019 9:55 AM

## 2019-07-21 ENCOUNTER — Ambulatory Visit (INDEPENDENT_AMBULATORY_CARE_PROVIDER_SITE_OTHER): Payer: BC Managed Care – PPO | Admitting: Family Medicine

## 2019-07-21 DIAGNOSIS — M25522 Pain in left elbow: Secondary | ICD-10-CM | POA: Diagnosis not present

## 2019-07-21 DIAGNOSIS — N5089 Other specified disorders of the male genital organs: Secondary | ICD-10-CM | POA: Diagnosis not present

## 2019-07-21 NOTE — Progress Notes (Signed)
    Chief Complaint:  Ryan Lam is a 43 y.o. male who presents today for a virtual office visit with a chief complaint of scrotal mass follow up.   Assessment/Plan:  Scrotal Mass Likely inclusion cyst based on [name] report.  Possibly could represent abscess however has no clinical signs that suggest this.  Will place referral to urology for further evaluation.  May need I&D and/or possible cyst removal.    Left elbow pain Slightly improving.  Unfortunately insurance did not cover Pennsaid.  He will try Voltaren gel if Pennsaid is too expensive.  Discussed reasons return to care.  Follow-up as needed.    Subjective:  HPI:  Patient was seen a few weeks ago for multiple complaints including right elbow pain, left thumb pain, and scrotal mass.  His elbow pain and left thumb pain has slightly improved.  Scrotal mass of the same.  Had an ultrasound done which showed possible infection versus inclusion cyst.  Area is not inflamed however it is still painful.  ROS: Per HPI  PMH: He reports that he has never smoked. He has never used smokeless tobacco. He reports current alcohol use of about 10.0 standard drinks of alcohol per week. He reports that he does not use drugs.      Objective/Observations  Physical Exam: Gen: NAD, resting comfortably Pulm: Normal work of breathing Neuro: Grossly normal, moves all extremities Psych: Normal affect and thought content  No results found for this or any previous visit (from the past 24 hour(s)).   Virtual Visit via Video   I connected with Caffie Pinto on 07/21/19 at 11:20 AM EDT by a video enabled telemedicine application and verified that I am speaking with the correct person using two identifiers. I discussed the limitations of evaluation and management by telemedicine and the availability of in person appointments. The patient expressed understanding and agreed to proceed.   Patient location: Home Provider location: [place] participating in the virtual visit: Myself and Patient     [name]. Jerline Pain, MD 07/21/2019 11:12 AM

## 2019-09-14 DIAGNOSIS — N453 Epididymo-orchitis: Secondary | ICD-10-CM | POA: Diagnosis not present

## 2019-10-11 ENCOUNTER — Encounter: Payer: Self-pay | Admitting: Family Medicine

## 2019-10-12 ENCOUNTER — Ambulatory Visit: Payer: BC Managed Care – PPO | Admitting: Family Medicine

## 2019-10-19 ENCOUNTER — Encounter: Payer: Self-pay | Admitting: Family Medicine

## 2020-01-26 ENCOUNTER — Other Ambulatory Visit: Payer: Self-pay

## 2020-01-26 ENCOUNTER — Telehealth: Payer: Self-pay | Admitting: Family Medicine

## 2020-01-26 MED ORDER — LEVOTHYROXINE SODIUM 88 MCG PO TABS: 88.0000 ug | ORAL_TABLET | Freq: Every day | ORAL | 0 refills | Status: DC

## 2020-01-26 NOTE — Telephone Encounter (Signed)
  LAST APPOINTMENT DATE: 10/12/2019   NEXT APPOINTMENT DATE:@3 /09/2020-Labs  MEDICATION:levothyroxine (SYNTHROID) 88 MCG tablet  PHARMACY:[facility] [address] [place] - [address]  COMMENT: Patient only has a few pills left but has an appointment for in office on the 26th.   **Let patient know to contact pharmacy at the end of the day to make sure medication is ready. **  ** Please notify patient to allow 48-72 hours to process**  **Encourage patient to contact the pharmacy for refills or they can request refills through [facility]**  CLINICAL FILLS OUT ALL BELOW:   LAST REFILL:  QTY:  REFILL DATE:    OTHER COMMENTS:    Okay for refill?  Please advise

## 2020-01-27 ENCOUNTER — Other Ambulatory Visit (INDEPENDENT_AMBULATORY_CARE_PROVIDER_SITE_OTHER): Payer: BC Managed Care – PPO

## 2020-01-27 DIAGNOSIS — E039 Hypothyroidism, unspecified: Secondary | ICD-10-CM

## 2020-01-27 LAB — TSH: TSH: 4.71 u[IU]/mL — ABNORMAL HIGH (ref 0.35–4.50)

## 2020-01-27 LAB — T4, FREE: Free T4: 0.89 ng/dL (ref 0.60–1.60)

## 2020-01-31 NOTE — Progress Notes (Signed)
Please inform patient of the following:  [name]  Ryan Lam. [name], MD 01/31/2020 9:21 AM

## 2020-02-11 ENCOUNTER — Encounter: Payer: Self-pay | Admitting: Family Medicine

## 2020-02-11 ENCOUNTER — Telehealth: Payer: Self-pay | Admitting: *Deleted

## 2020-02-11 ENCOUNTER — Other Ambulatory Visit: Payer: Self-pay

## 2020-02-11 ENCOUNTER — Ambulatory Visit: Payer: BC Managed Care – PPO | Admitting: Family Medicine

## 2020-02-11 VITALS — BP 110/78 | HR 63 | Temp 97.9°F | Ht 71.0 in | Wt 179.0 lb

## 2020-02-11 DIAGNOSIS — E039 Hypothyroidism, unspecified: Secondary | ICD-10-CM

## 2020-02-11 DIAGNOSIS — M79645 Pain in left finger(s): Secondary | ICD-10-CM

## 2020-02-11 DIAGNOSIS — E782 Mixed hyperlipidemia: Secondary | ICD-10-CM

## 2020-02-11 DIAGNOSIS — E349 Endocrine disorder, unspecified: Secondary | ICD-10-CM | POA: Diagnosis not present

## 2020-02-11 DIAGNOSIS — R739 Hyperglycemia, unspecified: Secondary | ICD-10-CM

## 2020-02-11 DIAGNOSIS — Z0001 Encounter for general adult medical examination with abnormal findings: Secondary | ICD-10-CM | POA: Diagnosis not present

## 2020-02-11 DIAGNOSIS — E559 Vitamin D deficiency, unspecified: Secondary | ICD-10-CM

## 2020-02-11 LAB — VITAMIN B12: Vitamin B-12: 465 pg/mL (ref 211–911)

## 2020-02-11 LAB — COMPREHENSIVE METABOLIC PANEL
ALT: 30 U/L (ref 0–53)
AST: 28 U/L (ref 0–37)
Albumin: 4.5 g/dL (ref 3.5–5.2)
Alkaline Phosphatase: 79 U/L (ref 39–117)
BUN: 13 mg/dL (ref 6–23)
CO2: 29 mEq/L (ref 19–32)
Calcium: 9.7 mg/dL (ref 8.4–10.5)
Chloride: 103 mEq/L (ref 96–112)
Creatinine, Ser: 1.16 mg/dL (ref 0.40–1.50)
GFR: 68.57 mL/min (ref >60.00)
Glucose, Bld: 87 mg/dL (ref 70–99)
Potassium: 4.4 mEq/L (ref 3.5–5.1)
Sodium: 138 mEq/L (ref 135–145)
Total Bilirubin: 0.5 mg/dL (ref 0.2–1.2)
Total Protein: 7.3 g/dL (ref 6.0–8.3)

## 2020-02-11 LAB — CBC
HCT: 44.5 % (ref 39.0–52.0)
Hemoglobin: 15 g/dL (ref 13.0–17.0)
MCHC: 33.7 g/dL (ref 30.0–36.0)
MCV: 93.3 fl (ref 78.0–100.0)
Platelets: 226 10*3/uL (ref 150.0–400.0)
RBC: 4.77 Mil/uL (ref 4.22–5.81)
RDW: 13 % (ref 11.5–15.5)
WBC: 7.9 10*3/uL (ref 4.0–10.5)

## 2020-02-11 LAB — HEMOGLOBIN A1C: Hgb A1c MFr Bld: 5 % (ref 4.6–6.5)

## 2020-02-11 LAB — LIPID PANEL
Cholesterol: 163 mg/dL (ref 0–200)
HDL: 46.9 mg/dL (ref >39.00)
LDL Cholesterol: 95 mg/dL (ref 0–99)
NonHDL: 116.29
Total CHOL/HDL Ratio: 3
Triglycerides: 105 mg/dL (ref 0.0–149.0)
VLDL: 21 mg/dL (ref 0.0–40.0)

## 2020-02-11 LAB — VITAMIN D 25 HYDROXY (VIT D DEFICIENCY, FRACTURES): VITD: 101.51 ng/mL (ref 30.00–100.00)

## 2020-02-11 LAB — TESTOSTERONE: Testosterone: 235.65 ng/dL — ABNORMAL LOW (ref 300.00–890.00)

## 2020-02-11 NOTE — Assessment & Plan Note (Signed)
Check vitamin D level today 

## 2020-02-11 NOTE — Assessment & Plan Note (Signed)
TSH mildly elevated.  We will continue Synthroid 88 mcg daily for now.  Will check other labs including CBC, C met, B12, and testosterone.  Depending on results may increase Synthroid to 100 mcg daily.

## 2020-02-11 NOTE — Assessment & Plan Note (Signed)
Continue lifestyle modifications.  Check lipid panel. 

## 2020-02-11 NOTE — Assessment & Plan Note (Signed)
Check testosterone level.  

## 2020-02-11 NOTE — Progress Notes (Signed)
Chief Complaint:  Ryan Lam is a 44 y.o. male who presents today for his annual comprehensive physical exam.    Assessment/Plan:  New/Acute Problems: Right lateral epicondylitis/back pain/bilateral thumb pain Has tried topical diclofenac with no significant improvement.  We will place referral to sports medicine for further evaluation/management.  Chronic Problems Addressed Today: Vitamin D deficiency Check vitamin D level today.  Mixed hyperlipidemia Continue lifestyle modifications.  Check lipid panel.  Testosterone deficiency Check testosterone level.  Hypothyroidism TSH mildly elevated.  We will continue Synthroid 88 mcg daily for now.  Will check other labs including CBC, C met, B12, and testosterone.  Depending on results may increase Synthroid to 100 mcg daily.  Preventative Healthcare: Up-to-date on vaccines and screenings.  Patient Counseling(The following topics were reviewed and/or handout was given):  -Nutrition: Stressed importance of moderation in sodium/caffeine intake, saturated fat and cholesterol, caloric balance, sufficient intake of fresh fruits, vegetables, and fiber.  -Stressed the importance of regular exercise.   -Substance Abuse: Discussed cessation/primary prevention of tobacco, alcohol, or other drug use; driving or other dangerous activities under the influence; availability of treatment for abuse.   -Injury prevention: Discussed safety belts, safety helmets, smoke detector, smoking near bedding or upholstery.   -Sexuality: Discussed sexually transmitted diseases, partner selection, use of condoms, avoidance of unintended pregnancy and contraceptive alternatives.   -Dental health: Discussed importance of regular tooth brushing, flossing, and dental visits.  -Health maintenance and immunizations reviewed. Please refer to Health maintenance section.  Return to care in 1 year for next preventative visit.     Subjective:  HPI:  Patient still  has ongoing issues with fatigue.  Seems to be at baseline.  He would like to have blood work done today.  He is on levothyroxine 88 mcg daily and doing well.  He still has persistent bilateral thumb pain and right elbow pain.  Riboflavin actually has improved over the last couple of days.  Has tried Voltaren with no significant improvement.  Lifestyle Diet: Balanced. Plenty of fruits and vegetables.  Exercise: More exercises on the weekends.   Depression screen PHQ 2/9 07/01/2019  Decreased Interest 0  Down, Depressed, Hopeless 0  PHQ - 2 Score 0    There are no preventive care reminders to display for this patient.   ROS: Per HPI, otherwise a complete review of systems was negative.   PMH:  The following were reviewed and entered/updated in epic: Past Medical History:  Diagnosis Date  . Chest pain 01/16/2019   to see dr Jerline Pain for 01-18-2019 or 01-19-2019  . GERD (gastroesophageal reflux disease)    hx of  . History of kidney stones   . Hypothyroidism   . Left inguinal hernia   . Sleep apnea    osa getting dental device   Patient Active Problem List   Diagnosis Date Noted  . Sleep-disordered breathing 10/07/2018  . Hypothyroidism 09/29/2014  . Testosterone deficiency 09/29/2014  . Mixed hyperlipidemia 09/29/2014  . Vitamin D deficiency 09/29/2014   Past Surgical History:  Procedure Laterality Date  . chelazon removed from eye Left 2019  . INGUINAL HERNIA REPAIR Left 01/20/2019   Procedure: LEFT OPEN HERNIA REPAIR INGUINAL  WITH MESH;  Surgeon: Kinsinger, Arta Bruce, MD;  Location: [facility];  Service: General;  Laterality: Left;  . LASIK  2008    Family History  Problem Relation Age of Onset  . Cancer Mother        hx breast  . COPD  Father   . AAA (abdominal aortic aneurysm) Father   . Diabetes Maternal Grandmother   . Stroke Maternal Grandfather     Medications- reviewed and updated Current Outpatient Medications  Medication Sig Dispense Refill   . Cholecalciferol ([employer] VITAMIN D) 2000 units CAPS Take 5,000 Units by mouth 2 (two) times daily.     . Diclofenac Sodium 1.5 % SOLN Use 3-4 times daily as needed 150 mL 3  . levothyroxine (SYNTHROID) 88 MCG tablet Take 1 tablet (88 mcg total) by mouth daily. 90 tablet 0  . Nutritional Supplements (JUICE PLUS FIBRE PO) Take by mouth.    . Zinc 25 MG TABS Take 50 mg by mouth daily.      No current facility-administered medications for this visit.    Allergies-reviewed and updated Allergies  Allergen Reactions  . Penicillins     Childhood reaction hives    Social History   Socioeconomic History  . Marital status: Married    Spouse name: Not on file  . Number of children: Not on file  . Years of education: Not on file  . Highest education level: Not on file  Occupational History  . Not on file  Tobacco Use  . Smoking status: Never Smoker  . Smokeless tobacco: Never Used  Substance and Sexual Activity  . Alcohol use: Yes    Alcohol/week: 10.0 standard drinks    Types: 10 Standard drinks or equivalent per week    Comment: 2-3 drinks 5 days week  . Drug use: No  . Sexual activity: Never  Other Topics Concern  . Not on file  Social History Narrative  . Not on file   Social Determinants of Health   Financial Resource Strain:   . Difficulty of Paying Living Expenses:   Food Insecurity:   . Worried About Charity fundraiser in the Last Year:   . Arboriculturist in the Last Year:   Transportation Needs:   . Film/video editor (Medical):   Marland Kitchen Lack of Transportation (Non-Medical):   Physical Activity:   . Days of Exercise per Week:   . Minutes of Exercise per Session:   Stress:   . Feeling of Stress :   Social Connections:   . Frequency of Communication with Friends and Family:   . Frequency of Social Gatherings with Friends and Family:   . Attends Religious Services:   . Active Member of Clubs or Organizations:   . Attends Archivist Meetings:   Marland Kitchen  Marital Status:         Objective:  Physical Exam: BP 110/78   Pulse 63   Temp 97.9 F (36.6 C)   Ht _0  (1.803 m)   Wt 179 lb (81.2 kg)   SpO2 98%   BMI 24.97 kg/m   Body mass index is 24.97 kg/m. Wt Readings from Last 3 Encounters:  02/11/20 179 lb (81.2 kg)  07/01/19 187 lb (84.8 kg)  04/27/19 182 lb (82.6 kg)   Gen: NAD, resting comfortably HEENT: TMs normal bilaterally. OP clear. No thyromegaly noted.  CV: RRR with no murmurs appreciated Pulm: NWOB, CTAB with no crackles, wheezes, or rhonchi GI: Normal bowel sounds present. Soft, Nontender, Nondistended. MSK: no edema, cyanosis, or clubbing noted Skin: warm, dry Neuro: CN2-12 grossly intact. Strength 5/5 in upper and lower extremities. Reflexes symmetric and intact bilaterally.  Psych: Normal affect and thought content     Macy Polio M. Jerline Pain, MD 02/11/2020 2:10 PM

## 2020-02-11 NOTE — Patient Instructions (Signed)
It was very nice to see you today!  We will check blood work today.  Depending on the results we may increase her thyroid medication.  I will place referral for you to see the sports medicine doctor.  Please work on the stretches for your back.  Please use Voltaren as needed for your hands.  Take care, Dr Jerline Pain  Please try these tips to maintain a healthy lifestyle:   Eat at least 3 REAL meals and 1-2 snacks per day.  Aim for no more than 5 hours between eating.  If you eat breakfast, please do so within one hour of getting up.    Each meal should contain half fruits/vegetables, one quarter protein, and one quarter carbs (no bigger than a computer mouse)   Cut down on sweet beverages. This includes juice, soda, and sweet tea.     Drink at least 1 glass of water with each meal and aim for at least 8 glasses per day   Exercise at least 150 minutes every week.    Preventive Care 19-39 Years Old, Male Preventive care refers to lifestyle choices and visits with your health care provider that can promote health and wellness. This includes:  A yearly physical exam. This is also called an annual well check.  Regular dental and eye exams.  Immunizations.  Screening for certain conditions.  Healthy lifestyle choices, such as eating a healthy diet, getting regular exercise, not using drugs or products that contain nicotine and tobacco, and limiting alcohol use. What can I expect for my preventive care visit? Physical exam Your health care provider will check:  Height and weight. These may be used to calculate body mass index (BMI), which is a measurement that tells if you are at a healthy weight.  Heart rate and blood pressure.  Your skin for abnormal spots. Counseling Your health care provider may ask you questions about:  Alcohol, tobacco, and drug use.  Emotional well-being.  Home and relationship well-being.  Sexual activity.  Eating habits.  Work and work  Statistician. What immunizations do I need?  Influenza (flu) vaccine  This is recommended every year. Tetanus, diphtheria, and pertussis (Tdap) vaccine  You may need a Td booster every 10 years. Varicella (chickenpox) vaccine  You may need this vaccine if you have not already been vaccinated. Zoster (shingles) vaccine  You may need this after age 46. Measles, mumps, and rubella (MMR) vaccine  You may need at least one dose of MMR if you were born in 1957 or later. You may also need a second dose. Pneumococcal conjugate (PCV13) vaccine  You may need this if you have certain conditions and were not previously vaccinated. Pneumococcal polysaccharide (PPSV23) vaccine  You may need one or two doses if you smoke cigarettes or if you have certain conditions. Meningococcal conjugate (MenACWY) vaccine  You may need this if you have certain conditions. Hepatitis A vaccine  You may need this if you have certain conditions or if you travel or work in places where you may be exposed to hepatitis A. Hepatitis B vaccine  You may need this if you have certain conditions or if you travel or work in places where you may be exposed to hepatitis B. Haemophilus influenzae type b (Hib) vaccine  You may need this if you have certain risk factors. Human papillomavirus (HPV) vaccine  If recommended by your health care provider, you may need three doses over 6 months. You may receive vaccines as individual doses or as  more than one vaccine together in one shot (combination vaccines). Talk with your health care provider about the risks and benefits of combination vaccines. What tests do I need? Blood tests  Lipid and cholesterol levels. These may be checked every 5 years, or more frequently if you are over 58 years old.  Hepatitis C test.  Hepatitis B test. Screening  Lung cancer screening. You may have this screening every year starting at age 35 if you have a 30-pack-year history of smoking  and currently smoke or have quit within the past 15 years.  Prostate cancer screening. Recommendations will vary depending on your family history and other risks.  Colorectal cancer screening. All adults should have this screening starting at age 27 and continuing until age 27. Your health care provider may recommend screening at age 23 if you are at increased risk. You will have tests every 1-10 years, depending on your results and the type of screening test.  Diabetes screening. This is done by checking your blood sugar (glucose) after you have not eaten for a while (fasting). You may have this done every 1-3 years.  Sexually transmitted disease (STD) testing. Follow these instructions at home: Eating and drinking  Eat a diet that includes fresh fruits and vegetables, whole grains, lean protein, and low-fat dairy products.  Take vitamin and mineral supplements as recommended by your health care provider.  Do not drink alcohol if your health care provider tells you not to drink.  If you drink alcohol: ? Limit how much you have to 0-2 drinks a day. ? Be aware of how much alcohol is in your drink. In the [place] one drink equals one 12 oz bottle of beer (355 mL), one 5 oz glass of wine (148 mL), or one 1 oz glass of hard liquor (44 mL). Lifestyle  Take daily care of your teeth and gums.  Stay active. Exercise for at least 30 minutes on 5 or more days each week.  Do not use any products that contain nicotine or tobacco, such as cigarettes, e-cigarettes, and chewing tobacco. If you need help quitting, ask your health care provider.  If you are sexually active, practice safe sex. Use a condom or other form of protection to prevent STIs (sexually transmitted infections).  Talk with your health care provider about taking a low-dose aspirin every day starting at age 69. What's next?  Go to your health care provider once a year for a well check visit.  Ask your health care provider how  often you should have your eyes and teeth checked.  Stay up to date on all vaccines. This information is not intended to replace advice given to you by your health care provider. Make sure you discuss any questions you have with your health care provider. Document Revised: 10/29/2018 Document Reviewed: 10/29/2018 Elsevier Patient Education  2020 [employer].

## 2020-02-11 NOTE — Telephone Encounter (Signed)
Critical Vitamin D level for patient Result 101.51,Notified Dr.Andy

## 2020-02-14 ENCOUNTER — Other Ambulatory Visit: Payer: Self-pay | Admitting: *Deleted

## 2020-02-14 DIAGNOSIS — E039 Hypothyroidism, unspecified: Secondary | ICD-10-CM

## 2020-02-14 MED ORDER — LEVOTHYROXINE SODIUM 100 MCG PO TABS: 100.0000 ug | ORAL_TABLET | Freq: Every day | ORAL | 3 refills | Status: DC

## 2020-02-14 NOTE — Progress Notes (Signed)
Please inform patient of the following:  Testosterone level is low, but improved compared to last time - do not think this is contributing significantly to his fatigue. Vitamin D is mildly elevated but everything else is NORMAL. Recommend that he cut vitamin D back to 2000IU daily.  We can try increasing his dose of synthroid as we discussed at his office visit. Recommend increasing to daily. Please send in new rx for patient. He needs to come back in 4-6 weeks to recheck TSH.  [name]. [name], MD 02/14/2020 12:59 PM

## 2020-02-14 NOTE — Telephone Encounter (Signed)
Noted. Please see result note.  [name]. [name], MD 02/14/2020 1:00 PM

## 2020-02-14 NOTE — Progress Notes (Signed)
tsh

## 2020-02-29 ENCOUNTER — Other Ambulatory Visit: Payer: Self-pay

## 2020-02-29 ENCOUNTER — Ambulatory Visit: Payer: Self-pay

## 2020-02-29 ENCOUNTER — Ambulatory Visit: Payer: BC Managed Care – PPO | Admitting: Family Medicine

## 2020-02-29 ENCOUNTER — Ambulatory Visit (INDEPENDENT_AMBULATORY_CARE_PROVIDER_SITE_OTHER): Payer: BC Managed Care – PPO

## 2020-02-29 ENCOUNTER — Encounter: Payer: Self-pay | Admitting: Family Medicine

## 2020-02-29 VITALS — BP 140/80 | HR 69 | Ht 71.0 in | Wt 178.8 lb

## 2020-02-29 DIAGNOSIS — M25521 Pain in right elbow: Secondary | ICD-10-CM

## 2020-02-29 DIAGNOSIS — M79645 Pain in left finger(s): Secondary | ICD-10-CM

## 2020-02-29 MED ORDER — NITROGLYCERIN 0.2 MG/HR TD PT24: MEDICATED_PATCH | TRANSDERMAL | 1 refills | Status: DC

## 2020-02-29 NOTE — Patient Instructions (Signed)
Thank you for coming in today.  Get xray today.  Tennis Elbow: Add the nitroglycerine patches.  Do the exercises.  Go from wrist up to wrist down slowly.  Keep the elbow straight.  Use 2 pound hand weight or theraband flexbar.   I think the left thumb is trigger thumb/finger.  OK to ignore if it does not bother you.  OK to use voltaren gel and try to stretch it.  Injection is next in line for this if needed.  There is a reliable surgery if needed.   Recheck in 6 weeks.  Let me know sooner if not doing well.   Nitroglycerin Protocol   Apply 1/4 nitroglycerin patch to affected area daily.  Change position of patch within the affected area every 24 hours.  You may experience a headache during the first 1-2 weeks of using the patch, these should subside.  If you experience headaches after beginning nitroglycerin patch treatment, you may take your preferred over the counter pain reliever.  Another side effect of the nitroglycerin patch is skin irritation or rash related to patch adhesive.  Please notify our office if you develop more severe headaches or rash, and stop the patch.  Tendon healing with nitroglycerin patch may require 12 to 24 weeks depending on the extent of injury.  Men should not use if taking Viagra, Cialis, or Levitra.   Do not use if you have migraines or rosacea.

## 2020-02-29 NOTE — Progress Notes (Signed)
Subjective:    I'm seeing this patient as a consultation for:  Dr Jerline Pain. Note will be routed back to referring provider/PCP.  CC: L  thumb pain and R elbow pain  I, Molly Weber, LAT, ATC, am serving as scribe for Dr. Lynne Leader.  HPI: Pt is a 44 y/o RHD male presenting w/ c/o L thumb and R elbow pain.  L thumb pain: Reports stiffness x 8 months w/ no known MOI.  He locates his pain to his L DIP.  He notes he is unable to fully extend his left thumb.  He notes is not very painful.  He is able to flex it normally and use his thumb pretty normally.  When asked he does not think it is bad enough to proceed with injection or surgery. -Swelling: No -Numbness/tingling: No -Aggravating factors: Attempts at L thumb extension or pressure/forcing L thumb into extension -Treatments tried: Voltaren  R elbow: Reports pain for over 1 year w/ no known MOI.  He locates his pain to his R lateral epicondyle w/ pain radiating into his R forearm. -Radiating pain: Yes into the R forearm -Swelling: No -Aggravating factors: gripping activity and twisting bottle tops -Treatments tried: Voltaren.  Voltaren gel did not help  Past medical history, Surgical history, Family history, Social history, Allergies, and medications have been entered into the medical record, reviewed.   Review of Systems: No new headache, visual changes, nausea, vomiting, diarrhea, constipation, dizziness, abdominal pain, skin rash, fevers, chills, night sweats, weight loss, swollen lymph nodes, body aches, joint swelling, muscle aches, chest pain, shortness of breath, mood changes, visual or auditory hallucinations.   Objective:    Vitals:   02/29/20 1140  BP: 140/80  Pulse: 69  SpO2: 96%   General: Well Developed, well nourished, and in no acute distress.  Neuro/Psych: Alert and oriented x3, extra-ocular muscles intact, able to move all 4 extremities, sensation grossly intact. Skin: Warm and dry, no rashes noted.    Respiratory: Not using accessory muscles, speaking in full sentences, trachea midline.  Cardiovascular: Pulses palpable, no extremity edema. Abdomen: Does not appear distended. MSK:  Right elbow normal-appearing Mildly tender palpation lateral epicondyle. Normal elbow motion and strength. Pain with resisted wrist and finger extension.  Left thumb normal-appearing Nontender. Normal thumb flexion.  Extension to neutral position patient is unable to actively or passively extend his IP joint past the neutral position. Strength is intact however to flexion extension within range of motion. Palpation MCP reveals small palpable nodule at flexor tendon A1 pulley. Left hand normal-appearing otherwise normal to physical exam otherwise.    Pulses cap refill and sensation are intact bilateral upper extremities.  Lab and Radiology Results  X-ray images right elbow obtained today personally and independently reviewed No acute fractures.  No lytic lesions or bone tumors. Await formal radiology review  Diagnostic Limited MSK Ultrasound of: Right lateral elbow Common flexor tendon insertion at lateral epicondyle intact without obvious tear. Lateral elbow otherwise normal-appearing with normal bony structures.  Left thumb MCP A1 pulley visible.  Flexor tendon visible.  Slight stenosis at A1 pulley visibly catching flexor tendon with extension of IP joint Slight increased fluid collecting tendon sheath at A1 pulley. Tendon intact without tear from A1 pulley onto insertion at distal phalanx. Left thumb IP joint normal-appearing to ultrasound examination  Impression: Right lateral epicondylitis. Left thumb trigger thumb   Impression and Recommendations:    Assessment and Plan: 44 y.o. male with  Right lateral epicondylitis ongoing for  over a year now.  Patient has had pain off and on with this for over a year now.  Symptoms are mild to moderate but are somewhat obnoxious and interfere with his  quality of life.  He already has initial trial of conservative management with trials of topical Voltaren gel with little benefit.  We will proceed with home exercise program and nitroglycerin patch protocol.  Check back in 6 weeks.  Return sooner if needed.  X-ray ordered today.  Left thumb lack of motion at IP joint.  Patient has an atypical presentation of trigger thumb.  Fortunately for him he does not have much triggering.  He has normal flexion and strength.  He only lacks full extension on his nondominant left thumb IP joint.  Discussed treatment options.  He certainly has had this long enough that more aggressive intervention such as injection or even surgical consultation are reasonable.  However he is not bothered enough by his symptoms to proceed with these at this time.  Plan to proceed with Voltaren gel and home exercise program.  If bothersome enough would recommend injection as next step.   Orders Placed This Encounter  Procedures  . [name] LIMITED JOINT SPACE STRUCTURES UP BILAT(NO LINKED CHARGES)    Order Specific Question:   Reason for Exam (SYMPTOM  OR DIAGNOSIS REQUIRED)    Answer:   R lateral elbow pain    Order Specific Question:   Preferred imaging location?    Answer:   Adult nurse [facility]  . DG ELBOW COMPLETE RIGHT (3+VIEW)    Standing Status:   Future    Number of Occurrences:   1    Standing Expiration Date:   04/30/2021    Order Specific Question:   Reason for Exam (SYMPTOM  OR DIAGNOSIS REQUIRED)    Answer:   eval right lateral elbow    Order Specific Question:   Preferred imaging location?    Answer:   [facility]    Order Specific Question:   Radiology Contrast Protocol - do NOT remove file path    Answer:   [email or web address]   Meds ordered this encounter  Medications  . nitroGLYCERIN (NITRODUR - DOSED IN MG/24 HR) 0.2 mg/hr patch    Sig: Apply 1/4 patch daily to tendon for tendonitis.    Dispense:   30 patch    Refill:  1    Discussed warning signs or symptoms. Please see discharge instructions. Patient expresses understanding.   The above documentation has been reviewed and is accurate and complete Clementeen Graham

## 2020-03-01 NOTE — Progress Notes (Signed)
X-ray right elbow looks pretty normal overall.  No concerning findings were present.

## 2020-04-11 ENCOUNTER — Ambulatory Visit: Payer: BC Managed Care – PPO | Admitting: Family Medicine

## 2020-04-11 NOTE — Progress Notes (Deleted)
   I, Christoper Fabian, LAT, ATC, am serving as scribe for Dr. Clementeen Graham.  Ryan Lam is a 44 y.o. male who presents to [facility] at [facility] today for f/u of R elbow and L thumb pain.  He was last seen by Dr. Denyse Amass on 02/29/20 and was prescribed nitroglycerin patches and R wrist extensor eccentrics.  Since his last visit, pt reports  Diagnostic imaging: R elbow XR- 02/29/20   Pertinent review of systems: ***  Relevant historical information: ***   Exam:  There were no vitals taken for this visit. General: Well Developed, well nourished, and in no acute distress.   MSK: ***    Lab and Radiology Results No results found for this or any previous visit (from the past 72 hour(s)). No results found.     Assessment and Plan: 44 y.o. male with ***   PDMP not reviewed this encounter. No orders of the defined types were placed in this encounter.  No orders of the defined types were placed in this encounter.    Discussed warning signs or symptoms. Please see discharge instructions. Patient expresses understanding.   ***

## 2020-04-19 ENCOUNTER — Ambulatory Visit: Payer: BC Managed Care – PPO | Admitting: Family Medicine

## 2020-04-20 ENCOUNTER — Encounter: Payer: Self-pay | Admitting: Family Medicine

## 2020-04-20 ENCOUNTER — Other Ambulatory Visit: Payer: Self-pay

## 2020-04-20 ENCOUNTER — Ambulatory Visit (INDEPENDENT_AMBULATORY_CARE_PROVIDER_SITE_OTHER): Payer: BC Managed Care – PPO | Admitting: Family Medicine

## 2020-04-20 VITALS — BP 110/84 | HR 74 | Ht 71.0 in | Wt 179.6 lb

## 2020-04-20 DIAGNOSIS — M7711 Lateral epicondylitis, right elbow: Secondary | ICD-10-CM | POA: Diagnosis not present

## 2020-04-20 NOTE — Patient Instructions (Signed)
Thank you for coming in today. Continue the patch and exercises.  Consider theraband flexbar if the grip on the water bottle is too large Recheck in 6 weeks if not better.  Next steps ... cortisone shot and or MRI for PRP injection or surgical planning if needed.

## 2020-04-20 NOTE — Progress Notes (Signed)
   I, Christoper Fabian, LAT, ATC, am serving as scribe for Dr. Clementeen Graham.  Ryan Lam is a 44 y.o. male who presents to [facility] at [facility] today for f/u of R elbow and L thumb (DIP).  He was last seen by Dr. Denyse Amass on 02/29/20 and was prescribed nitroglycerin patches for his R lateral epicondyle and shown a HEP focusing on wrist ext eccentrics.  Since his last visit, pt reports that his R elbow and L thumb are about the same.  He used the nitroglycerin patches intermittently but did have some issues w/ HA.  He did his HEP intermittently.  He tried the patches again recently and notes that she tolerated it just fine with no headache however.  Diagnostic testing: R elbow XR- 02/29/20   Pertinent review of systems: No fevers or chills  Relevant historical information: Hypothyroidism   Exam:  BP 110/84 (BP Location: Right Arm, Patient Position: Sitting, Cuff Size: Normal)   Pulse 74   Ht 5\' 11"  (1.803 m)   Wt 179 lb 9.6 oz (81.5 kg)   SpO2 97%   BMI 25.05 kg/m  General: Well Developed, well nourished, and in no acute distress.   MSK: Right elbow normal-appearing mildly tender palpation lateral epicondyle.  Pain with resisted wrist extension.    Assessment and Plan: 44 y.o. male with lateral epicondylitis right arm.  Not much better but patient really did not do much home exercise program or nitroglycerin patch protocol.  He did discussion about his options either dedicated trial home therapy or dedicated physical therapy or steroid injection.  He would like to really give it a good try on his own with home exercise program and being consistent with the nitroglycerin patches.  Recheck back in about 6 weeks if not better.  At that time would consider steroid injection or MRI.    Discussed warning signs or symptoms. Please see discharge instructions. Patient expresses understanding.   The above documentation has been reviewed and is accurate and complete 55, M.D.

## 2020-06-01 ENCOUNTER — Ambulatory Visit: Payer: BC Managed Care – PPO | Admitting: Family Medicine

## 2020-06-20 ENCOUNTER — Ambulatory Visit: Payer: BC Managed Care – PPO | Admitting: Family Medicine

## 2020-06-20 NOTE — Progress Notes (Deleted)
   I, Christoper Fabian, LAT, ATC, am serving as scribe for Dr. Clementeen Graham.  Ryan Lam is a 44 y.o. male who presents to [facility] at [facility] today for f/u of R elbow and L thumb pain.  He was last seen by Dr. Denyse Amass on 6/3/321 and was advised to con't w/ the original plan of using nitroglycerin patches for his R elbow and completing a HEP focusing on wrist eccentrics.  Since his last visit, pt reports  Diagnostic testing: R elbow XR- 02/29/20   Pertinent review of systems: ***  Relevant historical information: ***   Exam:  There were no vitals taken for this visit. General: Well Developed, well nourished, and in no acute distress.   MSK: ***    Lab and Radiology Results No results found for this or any previous visit (from the past 72 hour(s)). No results found.     Assessment and Plan: 44 y.o. male with ***   PDMP not reviewed this encounter. No orders of the defined types were placed in this encounter.  No orders of the defined types were placed in this encounter.    Discussed warning signs or symptoms. Please see discharge instructions. Patient expresses understanding.   ***

## 2020-06-21 ENCOUNTER — Encounter: Payer: Self-pay | Admitting: Family Medicine

## 2020-06-22 ENCOUNTER — Telehealth (INDEPENDENT_AMBULATORY_CARE_PROVIDER_SITE_OTHER): Payer: BC Managed Care – PPO | Admitting: Family Medicine

## 2020-06-22 DIAGNOSIS — J329 Chronic sinusitis, unspecified: Secondary | ICD-10-CM | POA: Diagnosis not present

## 2020-06-22 MED ORDER — AZELASTINE HCL 0.1 % NA SOLN: 2.0000 | Freq: Two times a day (BID) | NASAL | 12 refills | Status: DC

## 2020-06-22 MED ORDER — DOXYCYCLINE HYCLATE 100 MG PO TABS: 100.0000 mg | ORAL_TABLET | Freq: Two times a day (BID) | ORAL | 0 refills | Status: DC

## 2020-06-22 NOTE — Progress Notes (Signed)
   Ryan Lam is a 44 y.o. male who presents today for a virtual office visit.  Assessment/Plan:  New/Acute Problems: Sinusitis No red flags.  Given length of symptoms will start doxycycline.  Also start Astelin nasal spray.  Likely started as viral infection from his daughter.  Discussed possibility that this may represent Covid infection however would not test at this point as it would not significantly change her management.  Encourage good hydration.  He can continue using over-the-counter meds as needed as well.  Discussed reasons to return to care.  Follow-up as needed.     Subjective:  HPI:  Patient here with concerns for sinus infection.  Symptoms started about a week ago. Initially had a sore throat that improved over a day or so.  Tried taking over-the-counter medications including nasal sprays and Sudafed with some improvement.  Daughters been sick with similar symptoms.  No fevers or chills.  Symptoms include sinus pressure and congestion.  Feels like prior sinus infections.       Objective/Observations  Physical Exam: Gen: NAD, resting comfortably Pulm: Normal work of breathing Neuro: Grossly normal, moves all extremities Psych: Normal affect and thought content  Virtual Visit via Video   I connected with Ryan Lam on 06/22/20 at 11:40 AM EDT by a video enabled telemedicine application and verified that I am speaking with the correct person using two identifiers. The limitations of evaluation and management by telemedicine and the availability of in person appointments were discussed. The patient expressed understanding and agreed to proceed.   Patient location: Home Provider location: [place] [facility] Persons participating in the virtual visit: Myself and patient     [name]. [name], MD 06/22/2020 11:48 AM

## 2020-06-26 ENCOUNTER — Ambulatory Visit: Payer: BC Managed Care – PPO | Admitting: Family Medicine

## 2020-06-26 DIAGNOSIS — Z20822 Contact with and (suspected) exposure to covid-19: Secondary | ICD-10-CM | POA: Diagnosis not present

## 2020-06-26 NOTE — Progress Notes (Deleted)
   I, Christoper Fabian, LAT, ATC, am serving as scribe for Dr. Clementeen Graham.  Ryan Lam is a 44 y.o. male who presents to [facility] at [facility] today for f/u of R elbow and L thumb (DIP) pain.  He was last seen by Dr. Denyse Amass on 04/20/20 after intermittently using the nitro patches and doing his HEP.  Pt was advised to consistently use the nitroglycerin patches and consistently do his HEP and to f/u if not better.  Since his last visit, pt reports  Diagnostic testing: R elbow XR- 02/29/20  Pertinent review of systems: ***  Relevant historical information: ***   Exam:  There were no vitals taken for this visit. General: Well Developed, well nourished, and in no acute distress.   MSK: ***    Lab and Radiology Results No results found for this or any previous visit (from the past 72 hour(s)). No results found.     Assessment and Plan: 44 y.o. male with ***   PDMP not reviewed this encounter. No orders of the defined types were placed in this encounter.  No orders of the defined types were placed in this encounter.    Discussed warning signs or symptoms. Please see discharge instructions. Patient expresses understanding.   ***

## 2020-08-23 ENCOUNTER — Telehealth: Payer: Self-pay | Admitting: Physician Assistant

## 2020-08-23 ENCOUNTER — Telehealth: Payer: Self-pay

## 2020-08-23 DIAGNOSIS — Z03818 Encounter for observation for suspected exposure to other biological agents ruled out: Secondary | ICD-10-CM | POA: Diagnosis not present

## 2020-08-23 DIAGNOSIS — Z20822 Contact with and (suspected) exposure to covid-19: Secondary | ICD-10-CM | POA: Diagnosis not present

## 2020-08-23 NOTE — Telephone Encounter (Signed)
Nurse Assessment Nurse: Melanee Spry, RN, Beth Date/Time (Eastern Time): 08/23/2020 8:14:53 AM Confirm and document reason for call. If symptomatic, describe symptoms. ---Caller states c/o elevated heart rate, HR 100 per Apple watch. concerned he has Covid. Exposed to Covid on Sunday. Sinus HA yesterday. This morning c/o weakness. Does the patient have any new or worsening symptoms? ---Yes Will a triage be completed? ---Yes Related visit to physician within the last 2 weeks? ---No Does the PT have any chronic conditions? (i.e. diabetes, asthma, this includes High risk factors for pregnancy, etc.) ---Yes List chronic conditions. ---Thyriod Is this a behavioral health or substance abuse call? ---No Guidelines Guideline Title Affirmed Question Affirmed Notes Nurse Date/Time (Eastern Time) COVID-19 - Diagnosed or Suspected Chest pain or pressure Newhart, RN, Beth 08/23/2020 8:17:56 AM Disp. Time Ryan Lam Time) Disposition Final User 08/23/2020 8:19:46 AM Go to ED Now (or PCP triage) Yes Newhart, RN, Beth Caller Disagree/Comply Comply Caller Understands Yes PLEASE NOTE: All timestamps contained within this report are represented as [place] Standard Time. CONFIDENTIALTY NOTICE: This fax transmission is intended only for the addressee. It contains information that is legally privileged, confidential or otherwise protected from use or disclosure. If you are not the intended recipient, you are strictly prohibited from reviewing, disclosing, copying using or disseminating any of this information or taking any action in reliance on or regarding this information. If you have received this fax in error, please notify us immediately by telephone so that we can arrange for its return to [name]. Phone: [phone], Toll-Free: [phone], Fax: [phone] Page: 2 of 2 Call Id: [id number] PreDisposition Did not know what to do Care Advice Given Per Guideline GO TO ED NOW (OR PCP TRIAGE): * IF PCP SECOND-LEVEL  TRIAGE REQUIRED: You may need to be seen. Your doctor (or NP/PA) will want to talk with you to decide what's best. I'll page the provider on-call now. If you haven't heard from the provider (or me) within 30 minutes, go directly to the ED/UCC at _____________ Hospital. GENERAL CARE ADVICE FOR COVID-19 SYMPTOMS: * Feeling dehydrated: Drink extra liquids. If the air in your home is dry, use a humidifier. * Fever: For fever over 101 F (38.3 C), take acetaminophen every 4 to 6 hours (Adults 650 mg) OR ibuprofen every 6 to 8 hours (Adults 400 mg). Before taking any medicine, read all the instructions on the package. Do not take aspirin unless your doctor has prescribed it for you. CARE ADVICE given per COVID-19 - DIAGNOSED OR SUSPECTED (Adult) guideline. Referrals GO TO FACILITY OTHER - SPECIF

## 2020-08-23 NOTE — Telephone Encounter (Signed)
See below

## 2020-08-23 NOTE — Telephone Encounter (Signed)
LVM for patient offering an appt for covid testing, as well as a virtual visit tomorrow with Dr. Selena Batten.

## 2020-08-23 NOTE — Telephone Encounter (Signed)
Patient is on my schedule for Friday for possible COVID.  Please call him and see if we can get him tested here at our office on Thursday in the interim.  Additionally, please offer an appointment with Kriste Basque tomorrow if he would like to be see sooner.  Jarold Motto

## 2020-08-24 ENCOUNTER — Encounter: Payer: Self-pay | Admitting: Family Medicine

## 2020-08-24 ENCOUNTER — Other Ambulatory Visit: Payer: BC Managed Care – PPO

## 2020-08-24 ENCOUNTER — Telehealth (INDEPENDENT_AMBULATORY_CARE_PROVIDER_SITE_OTHER): Payer: BC Managed Care – PPO | Admitting: Family Medicine

## 2020-08-24 VITALS — HR 89 | Ht 67.0 in | Wt 177.0 lb

## 2020-08-24 DIAGNOSIS — Z20822 Contact with and (suspected) exposure to covid-19: Secondary | ICD-10-CM | POA: Diagnosis not present

## 2020-08-24 DIAGNOSIS — R0981 Nasal congestion: Secondary | ICD-10-CM

## 2020-08-24 DIAGNOSIS — E039 Hypothyroidism, unspecified: Secondary | ICD-10-CM | POA: Diagnosis not present

## 2020-08-24 DIAGNOSIS — R519 Headache, unspecified: Secondary | ICD-10-CM

## 2020-08-24 NOTE — Patient Instructions (Addendum)
-  stay home while sick, and if you have COVID19 please stay home for a full 10 days since the onset of symptoms PLUS one day of no fever and feeling better  -[facility] COVID19 testing information: [email or web address] [place] [id number]  -can use tylenol or aleve if needed for fevers, aches and pains per instructions; could try tiger balm as well  -can use nasal saline a few times per day if nasal congestion, sometimes a short course of Afrin nasal spray for 3 days can help as well  -stay hydrated, drink plenty of fluids and eat small healthy meals - avoid dairy  -can take 1000 IU Vit D3 and 500-1000mg  Vit C daily   -follow up with your doctor in 2-3 days unless improving and feeling better  I hope you are feeling better soon! Seek in-person care or a follow up telemedicine visit promptly if your symptoms worsen, new concerns arise or you are not improving as expected. Call 911 if severe symptoms.

## 2020-08-24 NOTE — Progress Notes (Signed)
Virtual Visit via Video Note  I connected with Ryan Lam  on 08/24/20 at 11:00 AM EDT by a video enabled telemedicine application and verified that I am speaking with the correct person using two identifiers.  Location patient: home, [place] Location provider:work or home office Persons participating in the virtual visit: patient, provider  I discussed the limitations of evaluation and management by telemedicine and the availability of in person appointments. The patient expressed understanding and agreed to proceed.   HPI:  Acute telemedicine visit for "possible COVID19": -Onset: yesterday -found out had close exposure with positive covid case 4 days ago; outside but within 6 feet for > 15 minutes, did not have masks on, talking -Symptoms include: HA, sinus congestion, lower energy level, ? Subjective fever, HR around 100 at times  - was anxious about the covid exposure, nausea yesterday morning -did rapid test for Covid yesterday that was negative -Denies: SOB, CP, loss of taste or smell, vomiting, diarrhea, inability to get out of bed/eat/drink -Has tried:ibuprofen -Pertinent past medical history: thyroid disease -Pertinent medication allergies: penicillin -COVID-19 vaccine status: not vaccinated  ROS: See pertinent positives and negatives per HPI.  Past Medical History:  Diagnosis Date  . Chest pain 01/16/2019   to see dr [name] for 01-18-2019 or 01-19-2019  . GERD (gastroesophageal reflux disease)    hx of  . History of kidney stones   . Hypothyroidism   . Left inguinal hernia   . Sleep apnea    osa getting dental device    Past Surgical History:  Procedure Laterality Date  . chelazon removed from eye Left 2019  . INGUINAL HERNIA REPAIR Left 01/20/2019   Procedure: LEFT OPEN HERNIA REPAIR INGUINAL  WITH MESH;  Surgeon: Kinsinger, De Blanch, MD;  Location: [facility] [place];  Service: General;  Laterality: Left;  . LASIK  2008     Current Outpatient Medications:  .   Cholecalciferol ([employer] VITAMIN D) 2000 units CAPS, Take 5,000 Units by mouth 2 (two) times daily. , Disp: , Rfl:  .  levothyroxine (SYNTHROID) 100 MCG tablet, Take 1 tablet (100 mcg total) by mouth daily., Disp: 90 tablet, Rfl: 3 .  Multiple Vitamins-Minerals (AIRBORNE PO), Take by mouth., Disp: , Rfl:  .  Zinc 25 MG TABS, Take 50 mg by mouth daily. , Disp: , Rfl:  .  azelastine (ASTELIN) 0.1 % nasal spray, Place 2 sprays into both nostrils 2 (two) times daily. (Patient not taking: Reported on 08/24/2020), Disp: 30 mL, Rfl: 12 .  Diclofenac Sodium 1.5 % SOLN, Use 3-4 times daily as needed (Patient not taking: Reported on 08/24/2020), Disp: 150 mL, Rfl: 3 .  Nutritional Supplements (JUICE PLUS FIBRE PO), Take by mouth. (Patient not taking: Reported on 08/24/2020), Disp: , Rfl:   EXAM:  VITALS per patient if applicable:  GENERAL: alert, oriented, appears well and in no acute distress  HEENT: atraumatic, conjunttiva clear, no obvious abnormalities on inspection of external nose and ears  NECK: normal movements of the head and neck  LUNGS: on inspection no signs of respiratory distress, breathing rate appears normal, no obvious gross SOB, gasping or wheezing  CV: no obvious cyanosis  MS: moves all visible extremities without noticeable abnormality  PSYCH/NEURO: pleasant and cooperative, no obvious depression or anxiety, speech and thought processing grossly intact  ASSESSMENT AND PLAN:  Discussed the following assessment and plan:  Contact with and (suspected) exposure to covid-19  Sinus congestion  Nonintractable headache, unspecified chronicity pattern, unspecified headache type  -we  discussed possible serious and likely etiologies, options for evaluation and workup, limitations of telemedicine visit vs in person visit, treatment, treatment risks and precautions. Pt prefers to treat via telemedicine empirically rather than in person at this moment.  Discussed potential etiologies for  his symptoms including viral upper respiratory illness, possible mild influenza, possible COVID-19 versus other.  He also is worried about possible thyroid level abnormality because of the elevated heart rate he noticed on his watch, though admits he also has been anxious after the Covid exposure and feeling sick.  After lengthy discussion he has opted for repeat Covid testing, discussed options.  Symptomatic care with over-the-counter analgesic if needed, nasal saline, short course of Afrin and hydration.  Home care tips summarized in patient instructions.  Did recommend a follow-up with his primary care doctor if he is having any worsening of symptoms or persistent symptoms.  Advised he is due for thyroid follow-up and should follow-up with his primary care doctor about this as they may require negative Covid testing or certain amount of time before they are able to see him in person.  Advised to check with his primary care office about this.  Discussed other options for in person care if he feels any worsening or is not improving or has severe symptoms and is unable to see his PCP.  Advised to seek prompt follow up telemedicine visit or in person care if worsening, new symptoms arise, or if is not improving with treatment. Did let this patient know that I only do telemedicine on Tuesdays and Thursdays for [place]. Advised to schedule follow up visit with PCP or UCC if any further questions or concerns to avoid delays in care.   I discussed the assessment and treatment plan with the patient. The patient was provided an opportunity to ask questions and all were answered. The patient agreed with the plan and demonstrated an understanding of the instructions.     Terressa Koyanagi, DO

## 2020-08-25 ENCOUNTER — Telehealth: Payer: BC Managed Care – PPO | Admitting: Physician Assistant

## 2020-08-28 ENCOUNTER — Encounter: Payer: Self-pay | Admitting: Family Medicine

## 2020-08-28 ENCOUNTER — Telehealth (INDEPENDENT_AMBULATORY_CARE_PROVIDER_SITE_OTHER): Payer: BC Managed Care – PPO | Admitting: Family Medicine

## 2020-08-28 VITALS — Ht 67.0 in | Wt 177.0 lb

## 2020-08-28 DIAGNOSIS — E039 Hypothyroidism, unspecified: Secondary | ICD-10-CM

## 2020-08-28 NOTE — Progress Notes (Signed)
   Ryan Lam is a 44 y.o. male who presents today for a virtual office visit.  Assessment/Plan:  New/Acute Problems: Tachycardia Home readings in the low 100s occasionally.  Currently in the 80s.  No red flag signs or symptoms.  Will check TSH as below.  Chronic Problems Addressed Today: Hypothyroidism Patient will come in to check TSH.  He is on Synthroid 100 mcg daily.  Had lengthy discussion with patient regarding recent Covid exposure and Covid vaccine.  Discussed risk and benefits of vaccination and recommended patient get vaccinated.    Subjective:  HPI:  Patient virtual visit 4 days ago with another provider.  Was having URI symptoms at that time and recent covid exposure  He was started on nasal saline Afrin, and oral hydration.  Does not currently have any symptoms. Has been having some issues with elevated heart rate that he thinks may be due to anxiety. No fevers or chills.          Objective/Observations  Physical Exam: Gen: NAD, resting comfortably Pulm: Normal work of breathing Neuro: Grossly normal, moves all extremities Psych: Normal affect and thought content  Virtual Visit via Video   I connected with Ryan Lam on 08/28/20 at  1:00 PM EDT by a video enabled telemedicine application and verified that I am speaking with the correct person using two identifiers. The limitations of evaluation and management by telemedicine and the availability of in person appointments were discussed. The patient expressed understanding and agreed to proceed.   Patient location: Home Provider location: [place] [employer] Persons participating in the virtual visit: Myself and Patient  Time Spent: 32 minutes of total time was spent on the date of the encounter performing the following actions: chart review prior to seeing the patient, obtaining history, performing a medically necessary exam, counseling on the treatment plan including covid vaccine  discussion, placing orders, and documenting in our EHR.       [name]. [name], MD 08/28/2020 1:32 PM

## 2020-08-28 NOTE — Assessment & Plan Note (Signed)
Patient will come in to check TSH.  He is on Synthroid 100 mcg daily.

## 2020-09-01 ENCOUNTER — Telehealth: Payer: Self-pay | Admitting: Family Medicine

## 2020-09-01 NOTE — Telephone Encounter (Signed)
Recv'd records from [facility] forwarded 4 pages to Dr. Jacquiline Doe 10/15/21fbg

## 2020-09-13 DIAGNOSIS — Z20822 Contact with and (suspected) exposure to covid-19: Secondary | ICD-10-CM | POA: Diagnosis not present

## 2020-11-08 ENCOUNTER — Encounter: Payer: Self-pay | Admitting: Physician Assistant

## 2020-11-08 ENCOUNTER — Telehealth (INDEPENDENT_AMBULATORY_CARE_PROVIDER_SITE_OTHER): Payer: BC Managed Care – PPO | Admitting: Physician Assistant

## 2020-11-08 VITALS — Ht 67.0 in | Wt 175.0 lb

## 2020-11-08 DIAGNOSIS — R059 Cough, unspecified: Secondary | ICD-10-CM | POA: Diagnosis not present

## 2020-11-08 MED ORDER — ALBUTEROL SULFATE HFA 108 (90 BASE) MCG/ACT IN AERS: 2.0000 | INHALATION_SPRAY | Freq: Four times a day (QID) | RESPIRATORY_TRACT | 0 refills | Status: DC | PRN

## 2020-11-08 MED ORDER — AZITHROMYCIN 250 MG PO TABS: ORAL_TABLET | ORAL | 0 refills | Status: DC

## 2020-11-08 NOTE — Progress Notes (Signed)
Virtual Visit via Video   I connected with Ryan Lam on 11/08/20 at 12:00 PM EST by a video enabled telemedicine application and verified that I am speaking with the correct person using two identifiers. Location patient: Home Location provider: [facility], Office Persons participating in the virtual visit: Jylan, [place] PA-C, Corky Mull, LPN   I discussed the limitations of evaluation and management by telemedicine and the availability of in person appointments. The patient expressed understanding and agreed to proceed.  I acted as a Neurosurgeon for [employer], [employer], LPN   Subjective:   HPI:  Cough Pt is c/o cough x 2 weeks, expectorating clear sputum. Denies fever of chills, no headaches. Slight nasal drainage. Pt has been using Robitussin and Mucinex DM. Pt says his daughters have been sick also and one is on antibiotics. Daughters were tested for COVID and this was negative.   Feels okay at night, taking nyquil which can help some.  Symptoms are gradually improving with time.   Denies: chest pain, SOB, wheezing  ROS: See pertinent positives and negatives per HPI.  Patient Active Problem List   Diagnosis Date Noted  . Sleep-disordered breathing 10/07/2018  . Hypothyroidism 09/29/2014  . Testosterone deficiency 09/29/2014  . Mixed hyperlipidemia 09/29/2014  . Vitamin D deficiency 09/29/2014    Social History   Tobacco Use  . Smoking status: Never Smoker  . Smokeless tobacco: Never Used  Substance Use Topics  . Alcohol use: Yes    Alcohol/week: 10.0 standard drinks    Types: 10 Standard drinks or equivalent per week    Comment: 2-3 drinks 5 days week    Current Outpatient Medications:  .  Cholecalciferol 50 MCG (2000 UT) CAPS, Take 5,000 Units by mouth 2 (two) times daily. , Disp: , Rfl:  .  levothyroxine (SYNTHROID) 100 MCG tablet, Take 1 tablet (100 mcg total) by mouth daily., Disp: 90 tablet, Rfl: 3 .  Multiple  Vitamins-Minerals (AIRBORNE PO), Take by mouth., Disp: , Rfl:  .  Nutritional Supplements (JUICE PLUS FIBRE PO), Take by mouth. , Disp: , Rfl:  .  Zinc 25 MG TABS, Take 50 mg by mouth daily. , Disp: , Rfl:  .  albuterol (VENTOLIN HFA) 108 (90 Base) MCG/ACT inhaler, Inhale 2 puffs into the lungs every 6 (six) hours as needed for wheezing or shortness of breath., Disp: 8 g, Rfl: 0 .  azithromycin (ZITHROMAX) 250 MG tablet, Take two tablets daily, then one daily x 4 days, Disp: 6 tablet, Rfl: 0  Allergies  Allergen Reactions  . Penicillins     Childhood reaction hives    Objective:   VITALS: Per patient if applicable, see vitals. GENERAL: Alert, appears well and in no acute distress. HEENT: Atraumatic, conjunctiva clear, no obvious abnormalities on inspection of external nose and ears. NECK: Normal movements of the head and neck. CARDIOPULMONARY: No increased WOB. Speaking in clear sentences. I:E ratio WNL.  MS: Moves all visible extremities without noticeable abnormality. PSYCH: Pleasant and cooperative, well-groomed. Speech normal rate and rhythm. Affect is appropriate. Insight and judgement are appropriate. Attention is focused, linear, and appropriate.  NEURO: CN grossly intact. Oriented as arrived to appointment on time with no prompting. Moves both UE equally.  SKIN: No obvious lesions, wounds, erythema, or cyanosis noted on face or hands.  Assessment and Plan:   Mikhi was seen today for cough.  Diagnoses and all orders for this visit:  Cough  Other orders -  albuterol (VENTOLIN HFA) 108 (90 Base) MCG/ACT inhaler; Inhale 2 puffs into the lungs every 6 (six) hours as needed for wheezing or shortness of breath. -     azithromycin (ZITHROMAX) 250 MG tablet; Take two tablets daily, then one daily x 4 days   No red flags on discussion, patient is not in any obvious distress during our visit. Discussed progression of most viral illness, and recommended supportive care at this  point in time.  Recommend starting nasal spray that he has at home (astelin) and also albuterol prn. I did however provide pocket rx for oral azithromycin should symptoms not improve as anticipated. Discussed over the counter supportive care options, with recommendations to push fluids and rest. Reviewed return precautions including new/worsening fever, SOB, new/worsening cough or other concerns.  Recommended need to self-quarantine and practice social distancing until symptoms resolve. I recommend that patient follow-up if symptoms worsen or persist despite treatment x 7-10 days, sooner if needed.  I discussed the assessment and treatment plan with the patient. The patient was provided an opportunity to ask questions and all were answered. The patient agreed with the plan and demonstrated an understanding of the instructions.   The patient was advised to call back or seek an in-person evaluation if the symptoms worsen or if the condition fails to improve as anticipated.   CMA or LPN served as scribe during this visit. History, Physical, and Plan performed by medical provider. The above documentation has been reviewed and is accurate and complete.   Patten, [place] 11/08/2020

## 2021-01-24 ENCOUNTER — Ambulatory Visit: Payer: BC Managed Care – PPO | Admitting: Physician Assistant

## 2021-01-24 ENCOUNTER — Other Ambulatory Visit: Payer: Self-pay

## 2021-01-24 ENCOUNTER — Encounter: Payer: Self-pay | Admitting: Physician Assistant

## 2021-01-24 VITALS — BP 144/90 | HR 78 | Temp 98.6°F | Ht 67.0 in | Wt 182.0 lb

## 2021-01-24 DIAGNOSIS — Z87442 Personal history of urinary calculi: Secondary | ICD-10-CM

## 2021-01-24 DIAGNOSIS — R350 Frequency of micturition: Secondary | ICD-10-CM

## 2021-01-24 LAB — POC URINALSYSI DIPSTICK (AUTOMATED)
Bilirubin, UA: NEGATIVE
Blood, UA: NEGATIVE
Glucose, UA: NEGATIVE
Ketones, UA: NEGATIVE
Leukocytes, UA: NEGATIVE
Nitrite, UA: NEGATIVE
Protein, UA: POSITIVE — AB
Spec Grav, UA: 1.025 (ref 1.010–1.025)
Urobilinogen, UA: 0.2 E.U./dL
pH, UA: 6 (ref 5.0–8.0)

## 2021-01-24 NOTE — Progress Notes (Signed)
Acute Office Visit  Subjective:    Patient ID: Ryan Lam, male    DOB: [birth year], 45 y.o.   MRN: [id number]  Chief Complaint  Patient presents with  . Urinary Tract Infection    HPI Patient is in today for possible UTI x 5 days. Started with low back pain and urinary frequency.  He then started to have a sharper pain over the weekend.  And now it is back to a dull intermittent low back pain.  He says he normally passes about 1 to 2 stones every year.  He says it does kind of feel like the normal pain with his kidney stones, but normally he "feels them rolling around" and that has not happened so far this time.  He has not had any severe pain, abdominal pain, fever, chills, nausea, or vomiting.  He has not seen any blood in his urine.  Past Medical History:  Diagnosis Date  . Chest pain 01/16/2019   to see dr [name] for 01-18-2019 or 01-19-2019  . GERD (gastroesophageal reflux disease)    hx of  . History of kidney stones   . Hypothyroidism   . Left inguinal hernia   . Sleep apnea    osa getting dental device    Past Surgical History:  Procedure Laterality Date  . chelazon removed from eye Left 2019  . INGUINAL HERNIA REPAIR Left 01/20/2019   Procedure: LEFT OPEN HERNIA REPAIR INGUINAL  WITH MESH;  Surgeon: Kinsinger, De Blanch, MD;  Location: [facility] [place];  Service: General;  Laterality: Left;  . LASIK  2008    Family History  Problem Relation Age of Onset  . Cancer Mother        hx breast  . COPD Father   . AAA (abdominal aortic aneurysm) Father   . Diabetes Maternal Grandmother   . Stroke Maternal Grandfather     Social History   Socioeconomic History  . Marital status: Married    Spouse name: Not on file  . Number of children: Not on file  . Years of education: Not on file  . Highest education level: Not on file  Occupational History  . Not on file  Tobacco Use  . Smoking status: Never Smoker  . Smokeless tobacco: Never Used  Vaping Use   . Vaping Use: Never used  Substance and Sexual Activity  . Alcohol use: Yes    Alcohol/week: 10.0 standard drinks    Types: 10 Standard drinks or equivalent per week    Comment: 2-3 drinks 5 days week  . Drug use: No  . Sexual activity: Never  Other Topics Concern  . Not on file  Social History Narrative  . Not on file   Social Determinants of Health   Financial Resource Strain: Not on file  Food Insecurity: Not on file  Transportation Needs: Not on file  Physical Activity: Not on file  Stress: Not on file  Social Connections: Not on file  Intimate Partner Violence: Not on file    Outpatient Medications Prior to Visit  Medication Sig Dispense Refill  . albuterol (VENTOLIN HFA) 108 (90 Base) MCG/ACT inhaler Inhale 2 puffs into the lungs every 6 (six) hours as needed for wheezing or shortness of breath. 8 g 0  . Cholecalciferol 50 MCG (2000 UT) CAPS Take 5,000 Units by mouth 2 (two) times daily.     Marland Kitchen levothyroxine (SYNTHROID) 100 MCG tablet Take 1 tablet (100 mcg total) by mouth daily. 90 tablet  3  . Multiple Vitamins-Minerals (AIRBORNE PO) Take by mouth.    . Nutritional Supplements (JUICE PLUS FIBRE PO) Take by mouth.     . Zinc 25 MG TABS Take 50 mg by mouth daily.     Marland Kitchen azithromycin (ZITHROMAX) 250 MG tablet Take two tablets daily, then one daily x 4 days 6 tablet 0   No facility-administered medications prior to visit.    Allergies  Allergen Reactions  . Penicillins     Childhood reaction hives    Review of Systems  Constitutional: Negative for chills and fever.  Gastrointestinal: Negative for abdominal pain, nausea and vomiting.  Genitourinary: Positive for frequency. Negative for decreased urine volume, dysuria, hematuria, penile discharge and penile pain.  Musculoskeletal: Positive for back pain.  Neurological: Negative for dizziness.       Objective:    Physical Exam Vitals and nursing note reviewed.  Constitutional:      General: He is not in acute  distress.    Appearance: Normal appearance.  Eyes:     Conjunctiva/sclera: Conjunctivae normal.     Pupils: Pupils are equal, round, and reactive to light.  Cardiovascular:     Rate and Rhythm: Normal rate and regular rhythm.     Heart sounds: No murmur heard.   Pulmonary:     Effort: Pulmonary effort is normal.     Breath sounds: Normal breath sounds.  Abdominal:     General: Abdomen is flat. Bowel sounds are normal.     Palpations: Abdomen is soft.     Tenderness: There is no abdominal tenderness. There is no right CVA tenderness or left CVA tenderness.  Neurological:     Mental Status: He is alert.  Psychiatric:        Mood and Affect: Mood normal.     BP (!) 144/90   Pulse 78   Temp 98.6 F (37 C)   Ht 5\' 7"  (1.702 m)   Wt 182 lb (82.6 kg)   SpO2 99%   BMI 28.51 kg/m  Wt Readings from Last 3 Encounters:  01/24/21 182 lb (82.6 kg)  11/08/20 175 lb (79.4 kg)  08/28/20 177 lb (80.3 kg)    There are no preventive care reminders to display for this patient.  There are no preventive care reminders to display for this patient.   Lab Results  Component Value Date   TSH 4.71 (H) 01/27/2020   Lab Results  Component Value Date   WBC 7.9 02/11/2020   HGB 15.0 02/11/2020   HCT 44.5 02/11/2020   MCV 93.3 02/11/2020   PLT 226.0 02/11/2020   Lab Results  Component Value Date   NA 138 02/11/2020   K 4.4 02/11/2020   CO2 29 02/11/2020   GLUCOSE 87 02/11/2020   BUN 13 02/11/2020   CREATININE 1.16 02/11/2020   BILITOT 0.5 02/11/2020   ALKPHOS 79 02/11/2020   AST 28 02/11/2020   ALT 30 02/11/2020   PROT 7.3 02/11/2020   ALBUMIN 4.5 02/11/2020   CALCIUM 9.7 02/11/2020   GFR 68.57 02/11/2020   Lab Results  Component Value Date   CHOL 163 02/11/2020   Lab Results  Component Value Date   HDL 46.90 02/11/2020   Lab Results  Component Value Date   LDLCALC 95 02/11/2020   Lab Results  Component Value Date   TRIG 105.0 02/11/2020   Lab Results   Component Value Date   CHOLHDL 3 02/11/2020   Lab Results  Component Value Date  HGBA1C 5.0 02/11/2020       Assessment & Plan:   Problem List Items Addressed This Visit   None   Visit Diagnoses    Frequent urination    -  Primary   Relevant Orders   POCT Urinalysis Dipstick (Automated) (Completed)   Urine Culture   History of renal stone          1. Frequent urination  2. History of renal stone Urinalysis is negative today.  There is no sign of infection or microscopic blood.  I will send it off for urinary culture with the symptoms he is having, but I will hold on any antibiotic prescription at this time.  Most likely he is trying to pass another renal stone.  Encouraged him to push a lot of fluids including water and lemon juice.  He may take Tylenol intermittently for pain.  In the event of any severe pain or symptoms he will go to the emergency department.  This visit occurred during the SARS-CoV-2 public health emergency.  Safety protocols were in place, including screening questions prior to the visit, additional usage of staff PPE, and extensive cleaning of exam room while observing appropriate contact time as indicated for disinfecting solutions.    Chontel Warning M Nikkie Liming, PA-C

## 2021-01-24 NOTE — Patient Instructions (Signed)
Your urinalysis in office looks clear. This may be a renal stone still trying to work its way through. Please push fluids, water / lemon juice. Tylenol for pain as needed. ER if acutely worse or persistent pain or if you stop urinating completely.  I will send urine culture report through MyChart.  Please schedule labs and CPE with Dr. [name].

## 2021-01-25 LAB — URINE CULTURE
MICRO NUMBER:: 11626912
Result:: NO GROWTH
SPECIMEN QUALITY:: ADEQUATE

## 2021-01-29 ENCOUNTER — Encounter: Payer: Self-pay | Admitting: Family Medicine

## 2021-01-29 ENCOUNTER — Encounter: Payer: Self-pay | Admitting: Physician Assistant

## 2021-01-29 NOTE — Telephone Encounter (Signed)
Patient schedule for tomorrow at 730

## 2021-01-30 ENCOUNTER — Ambulatory Visit: Payer: BC Managed Care – PPO | Admitting: Physician Assistant

## 2021-01-30 ENCOUNTER — Encounter: Payer: Self-pay | Admitting: Physician Assistant

## 2021-01-30 ENCOUNTER — Other Ambulatory Visit: Payer: Self-pay

## 2021-01-30 VITALS — BP 150/90 | HR 81 | Temp 97.3°F | Ht 67.0 in | Wt 184.0 lb

## 2021-01-30 DIAGNOSIS — Z9889 Other specified postprocedural states: Secondary | ICD-10-CM | POA: Diagnosis not present

## 2021-01-30 DIAGNOSIS — R319 Hematuria, unspecified: Secondary | ICD-10-CM

## 2021-01-30 DIAGNOSIS — R030 Elevated blood-pressure reading, without diagnosis of hypertension: Secondary | ICD-10-CM

## 2021-01-30 DIAGNOSIS — E039 Hypothyroidism, unspecified: Secondary | ICD-10-CM

## 2021-01-30 DIAGNOSIS — Z8719 Personal history of other diseases of the digestive system: Secondary | ICD-10-CM

## 2021-01-30 LAB — POCT URINALYSIS DIPSTICK
Bilirubin, UA: NEGATIVE
Glucose, UA: NEGATIVE
Ketones, UA: NEGATIVE
Leukocytes, UA: NEGATIVE
Nitrite, UA: NEGATIVE
Protein, UA: NEGATIVE
Spec Grav, UA: >=1.03 — AB (ref 1.010–1.025)
Urobilinogen, UA: 0.2 E.U./dL
pH, UA: 6 (ref 5.0–8.0)

## 2021-01-30 LAB — URINALYSIS, MICROSCOPIC ONLY

## 2021-01-30 LAB — COMPREHENSIVE METABOLIC PANEL
ALT: 34 U/L (ref 0–53)
AST: 28 U/L (ref 0–37)
Albumin: 4.1 g/dL (ref 3.5–5.2)
Alkaline Phosphatase: 75 U/L (ref 39–117)
BUN: 13 mg/dL (ref 6–23)
CO2: 30 mEq/L (ref 19–32)
Calcium: 9.7 mg/dL (ref 8.4–10.5)
Chloride: 103 mEq/L (ref 96–112)
Creatinine, Ser: 1.01 mg/dL (ref 0.40–1.50)
GFR: 90.49 mL/min (ref >60.00)
Glucose, Bld: 87 mg/dL (ref 70–99)
Potassium: 4.1 mEq/L (ref 3.5–5.1)
Sodium: 141 mEq/L (ref 135–145)
Total Bilirubin: 0.6 mg/dL (ref 0.2–1.2)
Total Protein: 7 g/dL (ref 6.0–8.3)

## 2021-01-30 LAB — CBC WITH DIFFERENTIAL/PLATELET
Basophils Absolute: 0.1 10*3/uL (ref 0.0–0.1)
Basophils Relative: 1 % (ref 0.0–3.0)
Eosinophils Absolute: 0.1 10*3/uL (ref 0.0–0.7)
Eosinophils Relative: 1 % (ref 0.0–5.0)
HCT: 44.3 % (ref 39.0–52.0)
Hemoglobin: 15.2 g/dL (ref 13.0–17.0)
Lymphocytes Relative: 26.8 % (ref 12.0–46.0)
Lymphs Abs: 1.5 10*3/uL (ref 0.7–4.0)
MCHC: 34.3 g/dL (ref 30.0–36.0)
MCV: 93 fl (ref 78.0–100.0)
Monocytes Absolute: 0.5 10*3/uL (ref 0.1–1.0)
Monocytes Relative: 9.3 % (ref 3.0–12.0)
Neutro Abs: 3.5 10*3/uL (ref 1.4–7.7)
Neutrophils Relative %: 61.9 % (ref 43.0–77.0)
Platelets: 205 10*3/uL (ref 150.0–400.0)
RBC: 4.77 Mil/uL (ref 4.22–5.81)
RDW: 13 % (ref 11.5–15.5)
WBC: 5.6 10*3/uL (ref 4.0–10.5)

## 2021-01-30 LAB — TSH: TSH: 3.26 u[IU]/mL (ref 0.35–4.50)

## 2021-01-30 NOTE — Patient Instructions (Signed)
It was great to see you!  We will be in touch with your blood work results.  Please go to the ultrasound as scheduled.  I think it would be beneficial to go ahead and schedule a follow-up appointment at this time with urology given these issues. They can help [name] manage whatever findings we conclude on u/s.  Take care,  Jarold Motto PA-C

## 2021-01-30 NOTE — Progress Notes (Signed)
**Note Ryan-Identified via Obfuscation** Ryan Lam is a 45 y.o. male here for a new problem.                                                                                                                                                    I acted as a Ryan Lam for [employer], Ryan Lam Ryan Mull, Ryan Lam   History of Present Illness:   Chief Complaint  Patient presents with  . Hematuria    HPI   Hematuria PMHx significant for epididymal orchitis with reactive hydrocele s/p L inguinal hernia repair in 2020. Followed by [facility], Ryan Lam.  He was seen by our office PA on 01/24/21 for frequent urination. Was suspected to be passing a kidney stone. Symptoms at that time were low back pain and urinary frequency. This has overall improved however, he now has noticed blood in urine x 2 yesterday as well as pain/pressure in the middle of testicles. Denies fever or chills, no back pain.  Has not returned to [facility] since 2020.   Elevated BP reading Currently taking no medications. At home blood pressure readings are: not routinely checked. Patient denies chest pain, SOB, blurred vision, dizziness, unusual headaches, lower leg swelling. Denies excessive caffeine intake, stimulant usage, excessive alcohol intake, or increase in salt consumption.  BP Readings from Last 3 Encounters:  01/30/21 (!) 150/90  01/24/21 (!) 144/90  04/20/20 110/84    Hypothyroidism Currently taking levothyroxine 100 mcg daily. Takes first thing on an empty stomach. Tolerates well, denies concerns but has been >1 year since checked.  Past Medical History:  Diagnosis Date  . Chest pain 01/16/2019   to see Ryan Lam for 01-18-2019 or 01-19-2019  . GERD (gastroesophageal reflux disease)    hx of  . History of kidney stones   . Hypothyroidism   . Left inguinal hernia   . Sleep apnea    osa getting dental device     Social History   Tobacco Use  . Smoking status: Never Smoker  . Smokeless tobacco: Never Used  Vaping Use  .  Vaping Use: Never used  Substance Use Topics  . Alcohol use: Yes    Alcohol/week: 10.0 standard drinks    Types: 10 Standard drinks or equivalent per week    Comment: 2-3 drinks 5 days week  . Drug use: No    Past Surgical History:  Procedure Laterality Date  . chelazon removed from eye Left 2019  . INGUINAL HERNIA REPAIR Left 01/20/2019   Procedure: LEFT OPEN HERNIA REPAIR INGUINAL  WITH MESH;  Surgeon: Ryan Lam, Ryan Blanch, Ryan Lam;  Location: [facility] [place];  Service: General;  Laterality: Left;  . LASIK  2008    Family History  Problem Relation Age of Onset  . Cancer Mother        hx breast  . COPD  Father   . AAA (abdominal aortic aneurysm) Father   . Diabetes Maternal Grandmother   . Stroke Maternal Grandfather     Allergies  Allergen Reactions  . Penicillins     Childhood reaction hives    Current Medications:   Current Outpatient Medications:  .  Cholecalciferol 50 MCG (2000 UT) CAPS, Take 5,000 Units by mouth 2 (two) times daily. , Disp: , Rfl:  .  levothyroxine (SYNTHROID) 100 MCG tablet, Take 1 tablet (100 mcg total) by mouth daily., Disp: 90 tablet, Rfl: 3 .  Multiple Vitamins-Minerals (AIRBORNE PO), Take by mouth., Disp: , Rfl:  .  Nutritional Supplements (JUICE PLUS FIBRE PO), Take by mouth. , Disp: , Rfl:  .  Zinc 25 MG TABS, Take 50 mg by mouth daily. , Disp: , Rfl:  .  albuterol (VENTOLIN HFA) 108 (90 Base) MCG/ACT inhaler, Inhale 2 puffs into the lungs every 6 (six) hours as needed for wheezing or shortness of breath. (Patient not taking: Reported on 01/30/2021), Disp: 8 g, Rfl: 0   Review of Systems:   ROS  Negative unless otherwise specified per HPI.  Vitals:   Vitals:   01/30/21 0751  BP: (!) 150/90  Pulse: 81  Temp: (!) 97.3 F (36.3 C)  TempSrc: Temporal  SpO2: 98%  Weight: 184 lb (83.5 kg)  Height: 5\' 7"  (1.702 m)     Body mass index is 28.82 kg/m.  Physical Exam:   Physical Exam Vitals and nursing note reviewed.   Constitutional:      General: He is not in acute distress.    Appearance: He is well-developed. He is not ill-appearing or toxic-appearing.  Cardiovascular:     Rate and Rhythm: Normal rate and regular rhythm.     Pulses: Normal pulses.     Heart sounds: Normal heart sounds, S1 normal and S2 normal.     Comments: No LE edema Pulmonary:     Effort: Pulmonary effort is normal.     Breath sounds: Normal breath sounds.  Skin:    General: Skin is warm and dry.  Neurological:     Mental Status: He is alert.     GCS: GCS eye subscore is 4. GCS verbal subscore is 5. GCS motor subscore is 6.  Psychiatric:        Speech: Speech normal.        Behavior: Behavior normal. Behavior is cooperative.     Results for orders placed or performed in visit on 01/30/21  POCT urinalysis dipstick  Result Value Ref Range   Color, UA dark yellow    Clarity, UA hazy    Glucose, UA Negative Negative   Bilirubin, UA negative    Ketones, UA negative    Spec Grav, UA >=1.030 (A) 1.010 - 1.025   Blood, UA trace    pH, UA 6.0 5.0 - 8.0   Protein, UA Negative Negative   Urobilinogen, UA 0.2 0.2 or 1.0 E.U./dL   Nitrite, UA negative    Leukocytes, UA Negative Negative   Appearance     Odor      Assessment and Plan:   Ryan Lam was seen today for hematuria.  Diagnoses and all orders for this visit:  Hematuria, unspecified type; History of hernia repair UA with trace blood. Will send for urine microscopy and culture. Reviewed plan with Ryan Lam, will order u/s to evaluate L inguinal region and scrotum. Recommend scheduling f/u appt with urology in case needed. Worsening precautions advised. -  POCT urinalysis dipstick -     Urine Microscopic -     Urine Culture -     Cancel: US PELVIS (TRANSABDOMINAL ONLY); Future -     Cancel: US Scrotum; Future -     Cancel: [name] LT UPPER EXTREM LTD SOFT TISSUE NON VASCULAR; Future -     [name] LT LOWER EXTREM LTD SOFT TISSUE NON VASCULAR; Future -     US SCROTUM  W/DOPPLER; Future  Elevated blood pressure reading Above goal. Will update blood work today. Asymptomatic. Follow-up in Ryan Lam in 1-2 months for further evaluation, check BP at home and if >130/80 consistently recommend in office eval. -     CBC with Differential/Platelet -     Comprehensive metabolic panel  Hypothyroidism, unspecified type Update TSH today per patient request.  Adjust 100 mcg levothyroxine dosage as indicated. -     TSH   CMA or Ryan Lam served as scribe during this visit. History, Physical, and Plan performed by medical provider. The above documentation has been reviewed and is accurate and complete.  Time spent with patient today was 25 minutes which consisted of chart review, discussing diagnosis, work up, treatment answering questions and documentation.   Jarold Motto, Ryan Lam

## 2021-01-31 ENCOUNTER — Ambulatory Visit (HOSPITAL_BASED_OUTPATIENT_CLINIC_OR_DEPARTMENT_OTHER): Payer: BC Managed Care – PPO

## 2021-01-31 ENCOUNTER — Other Ambulatory Visit (HOSPITAL_BASED_OUTPATIENT_CLINIC_OR_DEPARTMENT_OTHER): Payer: BC Managed Care – PPO

## 2021-01-31 LAB — URINE CULTURE
MICRO NUMBER:: 11648856
Result:: NO GROWTH
SPECIMEN QUALITY:: ADEQUATE

## 2021-02-01 ENCOUNTER — Ambulatory Visit (HOSPITAL_BASED_OUTPATIENT_CLINIC_OR_DEPARTMENT_OTHER)
Admission: RE | Admit: 2021-02-01 | Discharge: 2021-02-01 | Disposition: A | Discharge status: Home / Self Care | Payer: BC Managed Care – PPO | Source: Ambulatory Visit | Attending: Physician Assistant | Admitting: Physician Assistant

## 2021-02-01 ENCOUNTER — Other Ambulatory Visit: Payer: Self-pay

## 2021-02-01 ENCOUNTER — Ambulatory Visit (HOSPITAL_BASED_OUTPATIENT_CLINIC_OR_DEPARTMENT_OTHER): Payer: BC Managed Care – PPO

## 2021-02-01 DIAGNOSIS — Z8719 Personal history of other diseases of the digestive system: Secondary | ICD-10-CM | POA: Diagnosis not present

## 2021-02-01 DIAGNOSIS — Z9889 Other specified postprocedural states: Secondary | ICD-10-CM | POA: Insufficient documentation

## 2021-02-01 DIAGNOSIS — R319 Hematuria, unspecified: Secondary | ICD-10-CM | POA: Insufficient documentation

## 2021-02-01 DIAGNOSIS — N433 Hydrocele, unspecified: Secondary | ICD-10-CM | POA: Diagnosis not present

## 2021-02-01 DIAGNOSIS — N5089 Other specified disorders of the male genital organs: Secondary | ICD-10-CM | POA: Diagnosis not present

## 2021-02-02 ENCOUNTER — Ambulatory Visit: Payer: BC Managed Care – PPO | Admitting: Family Medicine

## 2021-02-06 ENCOUNTER — Encounter: Payer: Self-pay | Admitting: Physician Assistant

## 2021-02-07 ENCOUNTER — Encounter: Payer: Self-pay | Admitting: Physician Assistant

## 2021-03-01 ENCOUNTER — Encounter: Payer: Self-pay | Admitting: Family Medicine

## 2021-03-01 ENCOUNTER — Ambulatory Visit (INDEPENDENT_AMBULATORY_CARE_PROVIDER_SITE_OTHER): Payer: BC Managed Care – PPO | Admitting: Family Medicine

## 2021-03-01 ENCOUNTER — Other Ambulatory Visit: Payer: Self-pay

## 2021-03-01 VITALS — BP 136/88 | HR 78 | Temp 97.8°F | Ht 71.0 in | Wt 181.2 lb

## 2021-03-01 DIAGNOSIS — E782 Mixed hyperlipidemia: Secondary | ICD-10-CM

## 2021-03-01 DIAGNOSIS — E559 Vitamin D deficiency, unspecified: Secondary | ICD-10-CM | POA: Diagnosis not present

## 2021-03-01 DIAGNOSIS — Z125 Encounter for screening for malignant neoplasm of prostate: Secondary | ICD-10-CM

## 2021-03-01 DIAGNOSIS — E039 Hypothyroidism, unspecified: Secondary | ICD-10-CM

## 2021-03-01 DIAGNOSIS — E349 Endocrine disorder, unspecified: Secondary | ICD-10-CM

## 2021-03-01 DIAGNOSIS — Z0001 Encounter for general adult medical examination with abnormal findings: Secondary | ICD-10-CM

## 2021-03-01 DIAGNOSIS — N200 Calculus of kidney: Secondary | ICD-10-CM | POA: Diagnosis not present

## 2021-03-01 NOTE — Assessment & Plan Note (Signed)
We will follow-up with urology.  Recommended potassium citrate.

## 2021-03-01 NOTE — Assessment & Plan Note (Signed)
Check vitamin D. 

## 2021-03-01 NOTE — Assessment & Plan Note (Signed)
check testosterone

## 2021-03-01 NOTE — Assessment & Plan Note (Signed)
TSH last month ago.  Continue Synthroid 100 mcg daily.

## 2021-03-01 NOTE — Assessment & Plan Note (Signed)
Check lipids 

## 2021-03-01 NOTE — Patient Instructions (Signed)
It was very nice to see you today!  Will check blood work today.  Please check with your urologist soon about the hydrocele and possible kidney stones.  Please start taking potassium citrate.  You can take this is a supplement order as part of [employer] [facility].  I will see you back in a year for your next physical. Come back to see me sooner if needed.  Take care, Dr [name]  PLEASE NOTE:  If you had any lab tests please let us know if you have not heard back within a few days. You may see your results on mychart before we have a chance to review them but we will give you a call once they are reviewed by [name]. If we ordered any referrals today, please let us know if you have not heard from their office within the next week.   Please try these tips to maintain a healthy lifestyle:   Eat at least 3 REAL meals and 1-2 snacks per day.  Aim for no more than 5 hours between eating.  If you eat breakfast, please do so within one hour of getting up.    Each meal should contain half fruits/vegetables, one quarter protein, and one quarter carbs (no bigger than a computer mouse)   Cut down on sweet beverages. This includes juice, soda, and sweet tea.     Drink at least 1 glass of water with each meal and aim for at least 8 glasses per day   Exercise at least 150 minutes every week.    Preventive Care 44-45 Years Old, Male Preventive care refers to lifestyle choices and visits with your health care provider that can promote health and wellness. This includes:  A yearly physical exam. This is also called an annual wellness visit.  Regular dental and eye exams.  Immunizations.  Screening for certain conditions.  Healthy lifestyle choices, such as: ? Eating a healthy diet. ? Getting regular exercise. ? Not using drugs or products that contain nicotine and tobacco. ? Limiting alcohol use. What can I expect for my preventive care visit? Physical exam Your health care provider may check  your:  Height and weight. These may be used to calculate your BMI (body mass index). BMI is a measurement that tells if you are at a healthy weight.  Heart rate and blood pressure.  Body temperature.  Skin for abnormal spots. Counseling Your health care provider may ask you questions about your:  Past medical problems.  Family's medical history.  Alcohol, tobacco, and drug use.  Emotional well-being.  Home life and relationship well-being.  Sexual activity.  Diet, exercise, and sleep habits.  Work and work Astronomer.  Access to firearms. What immunizations do I need? Vaccines are usually given at various ages, according to a schedule. Your health care provider will recommend vaccines for you based on your age, medical history, and lifestyle or other factors, such as travel or where you work.   What tests do I need? Blood tests  Lipid and cholesterol levels. These may be checked every 5 years starting at age 36.  Hepatitis C test.  Hepatitis B test. Screening  Diabetes screening. This is done by checking your blood sugar (glucose) after you have not eaten for a while (fasting).  Genital exam to check for testicular cancer or hernias.  STD (sexually transmitted disease) testing, if you are at risk. Talk with your health care provider about your test results, treatment options, and if necessary, the need for  more tests.   Follow these instructions at home: Eating and drinking  Eat a healthy diet that includes fresh fruits and vegetables, whole grains, lean protein, and low-fat dairy products.  Drink enough fluid to keep your urine pale yellow.  Take vitamin and mineral supplements as recommended by your health care provider.  Do not drink alcohol if your health care provider tells you not to drink.  If you drink alcohol: ? Limit how much you have to 0-2 drinks a day. ? Be aware of how much alcohol is in your drink. In the [place] one drink equals one 12 oz  bottle of beer (355 mL), one 5 oz glass of wine (148 mL), or one 1 oz glass of hard liquor (44 mL).   Lifestyle  Take daily care of your teeth and gums. Brush your teeth every morning and night with fluoride toothpaste. Floss one time each day.  Stay active. Exercise for at least 30 minutes 5 or more days each week.  Do not use any products that contain nicotine or tobacco, such as cigarettes, e-cigarettes, and chewing tobacco. If you need help quitting, ask your health care provider.  Do not use drugs.  If you are sexually active, practice safe sex. Use a condom or other form of protection to prevent STIs (sexually transmitted infections).  Find healthy ways to cope with stress, such as: ? Meditation, yoga, or listening to music. ? Journaling. ? Talking to a trusted person. ? Spending time with friends and family. Safety  Always wear your seat belt while driving or riding in a vehicle.  Do not drive: ? If you have been drinking alcohol. Do not ride with someone who has been drinking. ? When you are tired or distracted. ? While texting.  Wear a helmet and other protective equipment during sports activities.  If you have firearms in your house, make sure you follow all gun safety procedures.  Seek help if you have been physically or sexually abused. What's next?  Go to your health care provider once a year for an annual wellness visit.  Ask your health care provider how often you should have your eyes and teeth checked.  Stay up to date on all vaccines. This information is not intended to replace advice given to you by your health care provider. Make sure you discuss any questions you have with your health care provider. Document Revised: 07/21/2019 Document Reviewed: 10/29/2018 Elsevier Patient Education  2021 [employer].

## 2021-03-01 NOTE — Progress Notes (Signed)
Chief Complaint:  Ryan Lam is a 45 y.o. male who presents today for his annual comprehensive physical exam.    Assessment/Plan:  New/Acute Problems: Left groin pain Possibly due to hydrocele.  Also possibly could have had a kidney stone that is since passed.  Advised him to follow-up with urology soon.  He voiced understanding.  Chronic Problems Addressed Today: Vitamin D deficiency Check vitamin D.  Mixed hyperlipidemia Check lipids  Testosterone deficiency  check testosterone  Hypothyroidism TSH last month ago.  Continue Synthroid 100 mcg daily.  Kidney stones We will follow-up with urology.  Recommended potassium citrate.   Preventative Healthcare: Check lipids today.  Up-to-date on vaccines and screenings.  Patient Counseling(The following topics were reviewed and/or handout was given):  -Nutrition: Stressed importance of moderation in sodium/caffeine intake, saturated fat and cholesterol, caloric balance, sufficient intake of fresh fruits, vegetables, and fiber.  -Stressed the importance of regular exercise.   -Substance Abuse: Discussed cessation/primary prevention of tobacco, alcohol, or other drug use; driving or other dangerous activities under the influence; availability of treatment for abuse.   -Injury prevention: Discussed safety belts, safety helmets, smoke detector, smoking near bedding or upholstery.   -Sexuality: Discussed sexually transmitted diseases, partner selection, use of condoms, avoidance of unintended pregnancy and contraceptive alternatives.   -Dental health: Discussed importance of regular tooth brushing, flossing, and dental visits.  -Health maintenance and immunizations reviewed. Please refer to Health maintenance section.  Return to care in 1 year for next preventative visit.     Subjective:  HPI:  He has no acute complaints today.  He was seen recently by the provider in the office for scrotal and groin pain.  An ultrasound  which showed a large hydrocele.  Work-up also notable for hematuria with calcium oxalate stones.  Lifestyle Diet: Balanced. Cutting down on meat.  Exercise: Limited due to orthopedic issues.  Depression screen [facility] 2/9 03/01/2021  Decreased Interest 0  Down, Depressed, Hopeless 0  PHQ - 2 Score 0   ROS: Per HPI, otherwise a complete review of systems was negative.   PMH:  The following were reviewed and entered/updated in epic: Past Medical History:  Diagnosis Date  . Chest pain 01/16/2019   to see dr [name] for 01-18-2019 or 01-19-2019  . GERD (gastroesophageal reflux disease)    hx of  . History of kidney stones   . Hypothyroidism   . Left inguinal hernia   . Sleep apnea    osa getting dental device   Patient Active Problem List   Diagnosis Date Noted  . Sleep-disordered breathing 10/07/2018  . Hypothyroidism 09/29/2014  . Testosterone deficiency 09/29/2014  . Mixed hyperlipidemia 09/29/2014  . Vitamin D deficiency 09/29/2014  . Kidney stones 10/27/2013   Past Surgical History:  Procedure Laterality Date  . chelazon removed from eye Left 2019  . INGUINAL HERNIA REPAIR Left 01/20/2019   Procedure: LEFT OPEN HERNIA REPAIR INGUINAL  WITH MESH;  Surgeon: Kinsinger, De Blanch, MD;  Location: [facility] [place];  Service: General;  Laterality: Left;  . LASIK  2008    Family History  Problem Relation Age of Onset  . Cancer Mother        hx breast  . COPD Father   . AAA (abdominal aortic aneurysm) Father   . Diabetes Maternal Grandmother   . Stroke Maternal Grandfather     Medications- reviewed and updated Current Outpatient Medications  Medication Sig Dispense Refill  . albuterol (VENTOLIN HFA) 108 (90 Base)  MCG/ACT inhaler Inhale 2 puffs into the lungs every 6 (six) hours as needed for wheezing or shortness of breath. 8 g 0  . Cholecalciferol 50 MCG (2000 UT) CAPS Take 5,000 Units by mouth 2 (two) times daily.     Marland Kitchen levothyroxine (SYNTHROID) 100 MCG tablet  Take 1 tablet (100 mcg total) by mouth daily. 90 tablet 3  . Multiple Vitamins-Minerals (AIRBORNE PO) Take by mouth.    . Zinc 25 MG TABS Take 50 mg by mouth daily.      No current facility-administered medications for this visit.    Allergies-reviewed and updated Allergies  Allergen Reactions  . Penicillins     Childhood reaction hives    Social History   Socioeconomic History  . Marital status: Married    Spouse name: Not on file  . Number of children: Not on file  . Years of education: Not on file  . Highest education level: Not on file  Occupational History  . Not on file  Tobacco Use  . Smoking status: Never Smoker  . Smokeless tobacco: Never Used  Vaping Use  . Vaping Use: Never used  Substance and Sexual Activity  . Alcohol use: Yes    Alcohol/week: 10.0 standard drinks    Types: 10 Standard drinks or equivalent per week    Comment: 2-3 drinks 5 days week  . Drug use: No  . Sexual activity: Never  Other Topics Concern  . Not on file  Social History Narrative  . Not on file   Social Determinants of Health   Financial Resource Strain: Not on file  Food Insecurity: Not on file  Transportation Needs: Not on file  Physical Activity: Not on file  Stress: Not on file  Social Connections: Not on file        Objective:  Physical Exam: BP 136/88   Pulse 78   Temp 97.8 F (36.6 C) (Temporal)   Ht 5\' 11"  (1.803 m)   Wt 181 lb 3.2 oz (82.2 kg)   SpO2 96%   BMI 25.27 kg/m   Body mass index is 25.27 kg/m. Wt Readings from Last 3 Encounters:  03/01/21 181 lb 3.2 oz (82.2 kg)  01/30/21 184 lb (83.5 kg)  01/24/21 182 lb (82.6 kg)   Gen: NAD, resting comfortably HEENT: TMs normal bilaterally. OP clear. No thyromegaly noted.  CV: RRR with no murmurs appreciated Pulm: NWOB, CTAB with no crackles, wheezes, or rhonchi GI: Normal bowel sounds present. Soft, Nontender, Nondistended. MSK: no edema, cyanosis, or clubbing noted Skin: warm, dry Neuro: CN2-12  grossly intact. Strength 5/5 in upper and lower extremities. Reflexes symmetric and intact bilaterally.  Psych: Normal affect and thought content     Marialy Urbanczyk M. 03/26/21, MD 03/01/2021 4:19 PM

## 2021-03-02 LAB — PSA: PSA: 1.06 ng/mL (ref <=4.0)

## 2021-03-02 LAB — LIPID PANEL
Cholesterol: 168 mg/dL (ref <200)
HDL: 50 mg/dL (ref >=40)
LDL Cholesterol (Calc): 90 mg/dL (calc)
Non-HDL Cholesterol (Calc): 118 mg/dL (calc) (ref <130)
Total CHOL/HDL Ratio: 3.4 (calc) (ref <5.0)
Triglycerides: 188 mg/dL — ABNORMAL HIGH (ref <150)

## 2021-03-02 LAB — VITAMIN D 25 HYDROXY (VIT D DEFICIENCY, FRACTURES): Vit D, 25-Hydroxy: 49 ng/mL (ref 30–100)

## 2021-03-02 LAB — TESTOSTERONE: Testosterone: 244 ng/dL — ABNORMAL LOW (ref 250–827)

## 2021-03-05 NOTE — Progress Notes (Signed)
Please inform patient of the following:  His testosterone level is low but better than previous. All of his other labs are stable. We can refer to endocrinology if he is interested in replacement.  [name]. [name], MD 03/05/2021 3:44 PM

## 2021-03-06 ENCOUNTER — Encounter: Payer: Self-pay | Admitting: Family Medicine

## 2021-03-12 ENCOUNTER — Other Ambulatory Visit: Payer: Self-pay | Admitting: Family Medicine

## 2021-03-15 DIAGNOSIS — H40013 Open angle with borderline findings, low risk, bilateral: Secondary | ICD-10-CM | POA: Diagnosis not present

## 2021-04-03 NOTE — Progress Notes (Signed)
I, Christoper Fabian, LAT, ATC, am serving as scribe for Dr. Clementeen Graham.  Ryan Lam is a 45 y.o. male who presents to [facility] today for L shoulder pain.  He was last seen by Dr. Denyse Amass on 04/20/20 for R lateral epicondylitis.  Since then, pt reports L shoulder pain x a month.  He locates his pain to varying locations. Pt was more anterior, then moved posteriorly, and not feels like its moving into his neck. No numbness/tingling noted. Pt reports he is starting to feel some weakness in arm when weight lifting.  Pain is quite obnoxious and is significantly interfering with his quality of life.  L shoulder mechanical symptoms: yes Aggravating factors: certain varying movements, horz add/abd Treatments tried: massage, Voltaren gel, icy hot   Pertinent review of systems: No fevers or chills  Relevant historical information: History right lateral epicondylitis successfully treated with conservative management.   Exam:  BP (!) 138/96 (BP Location: Right Arm, Patient Position: Sitting, Cuff Size: Normal)   Pulse 75   Ht 5\' 11"  (1.803 m)   Wt 181 lb 9.6 oz (82.4 kg)   SpO2 98%   BMI 25.33 kg/m  General: Well Developed, well nourished, and in no acute distress.   MSK: Left shoulder normal-appearing Nontender. Normal motion pain with abduction. Intact strength. Positive Hawkins and Neer's test positive empty can test positive crossover arm compression test.  Positive O'Brien test. Negative Yergason's and speeds test.    Lab and Radiology Results  Procedure: Real-time Ultrasound Guided Injection of left shoulder subacromial bursa Device: Philips Affiniti 50G Images permanently stored and available for review in PACS Ultrasound evaluation prior to injection reveals moderate subacromial bursitis with intact rotator cuff tendons. Verbal informed consent obtained.  Discussed risks and benefits of procedure. Warned about infection bleeding damage to  structures skin hypopigmentation and fat atrophy among others. Patient expresses understanding and agreement Time-out conducted.   Noted no overlying erythema, induration, or other signs of local infection.   Skin prepped in a sterile fashion.   Local anesthesia: Topical Ethyl chloride.   With sterile technique and under real time ultrasound guidance:  40 mg of Kenalog and 2 mL of Marcaine injected into subacromial bursa. Fluid seen entering the bursa.   Completed without difficulty   Pain immediately resolved suggesting accurate placement of the medication.   Advised to call if fevers/chills, erythema, induration, drainage, or persistent bleeding.   Images permanently stored and available for review in the ultrasound unit.  Impression: Technically successful ultrasound guided injection.      Assessment and Plan: 45 y.o. male with shoulder pain thought to be due predominantly to subacromial bursitis.  Plan for injection today and a home exercise program as taught in clinic by ATC and referral to physical therapy.  Recheck in about 6 weeks.  Return sooner if needed.  Precautions reviewed.   PDMP not reviewed this encounter. Orders Placed This Encounter  Procedures  . 59 LIMITED JOINT SPACE STRUCTURES UP LEFT(NO LINKED CHARGES)    Standing Status:   Future    Number of Occurrences:   1    Standing Expiration Date:   10/05/2021    Order Specific Question:   Reason for Exam (SYMPTOM  OR DIAGNOSIS REQUIRED)    Answer:   left shoulder pain    Order Specific Question:   Preferred imaging location?    Answer:   10/07/2021 [facility]  . Ambulatory referral to Physical Therapy  Referral Priority:   Routine    Referral Type:   Physical Medicine    Referral Reason:   Specialty Services Required    Requested Specialty:   Physical Therapy   No orders of the defined types were placed in this encounter.    Discussed warning signs or symptoms. Please see discharge  instructions. Patient expresses understanding.   The above documentation has been reviewed and is accurate and complete Clementeen Graham, M.D.

## 2021-04-04 ENCOUNTER — Ambulatory Visit: Payer: BC Managed Care – PPO | Admitting: Family Medicine

## 2021-04-04 ENCOUNTER — Ambulatory Visit: Payer: Self-pay

## 2021-04-04 ENCOUNTER — Other Ambulatory Visit: Payer: Self-pay

## 2021-04-04 VITALS — BP 138/96 | HR 75 | Ht 71.0 in | Wt 181.6 lb

## 2021-04-04 DIAGNOSIS — M25512 Pain in left shoulder: Secondary | ICD-10-CM

## 2021-04-04 DIAGNOSIS — M719 Bursopathy, unspecified: Secondary | ICD-10-CM | POA: Diagnosis not present

## 2021-04-04 DIAGNOSIS — M67919 Unspecified disorder of synovium and tendon, unspecified shoulder: Secondary | ICD-10-CM | POA: Diagnosis not present

## 2021-04-04 NOTE — Patient Instructions (Addendum)
Thank you for coming in today.  Please complete the exercises that the athletic trainer went over with you: View at [email or web address] using code: [id number]  I've referred you to Physical Therapy.  Let us know if you don't hear from them in one week.  Please use Voltaren gel (Generic Diclofenac Gel) up to 4x daily for pain as needed.  This is available over-the-counter as both the name brand Voltaren gel and the generic diclofenac gel.  Recheck in about 6 weeks. If not better next step is MRI  Let me know sooner if you have a problem.

## 2021-04-12 ENCOUNTER — Ambulatory Visit: Payer: BC Managed Care – PPO | Admitting: Physical Therapy

## 2021-04-12 ENCOUNTER — Other Ambulatory Visit: Payer: Self-pay

## 2021-04-12 ENCOUNTER — Encounter: Payer: Self-pay | Admitting: Physical Therapy

## 2021-04-12 DIAGNOSIS — M25612 Stiffness of left shoulder, not elsewhere classified: Secondary | ICD-10-CM

## 2021-04-12 DIAGNOSIS — M6281 Muscle weakness (generalized): Secondary | ICD-10-CM

## 2021-04-12 DIAGNOSIS — M25512 Pain in left shoulder: Secondary | ICD-10-CM

## 2021-04-12 DIAGNOSIS — G8929 Other chronic pain: Secondary | ICD-10-CM | POA: Diagnosis not present

## 2021-04-12 NOTE — Therapy (Addendum)
[facility] [address] [place], [place], [address] Phone: [phone]   Fax:  [phone]  Physical Therapy Evaluation & Discharge  Patient Details  Name: Ryan Lam MRN: [id number] Date of Birth: 06-19-1976 Referring Provider (PT): Dr. Georgina Snell   Encounter Date: 04/12/2021   PT End of Session - 04/12/21 1649     Visit Number 1    Number of Visits 13    Date for PT Re-Evaluation 07/11/21    Authorization Type BCBS    PT Start Time 1600    PT Stop Time 3903    PT Time Calculation (min) 45 min    Activity Tolerance Patient tolerated treatment well    Behavior During Therapy [facility] for tasks assessed/performed             Past Medical History:  Diagnosis Date   Chest pain 01/16/2019   to see dr Jerline Pain for 01-18-2019 or 01-19-2019   GERD (gastroesophageal reflux disease)    hx of   History of kidney stones    Hypothyroidism    Left inguinal hernia    Sleep apnea    osa getting dental device    Past Surgical History:  Procedure Laterality Date   chelazon removed from eye Left 2019   INGUINAL HERNIA REPAIR Left 01/20/2019   Procedure: LEFT OPEN HERNIA REPAIR INGUINAL  WITH MESH;  Surgeon: Kinsinger, Arta Bruce, MD;  Location: [facility];  Service: General;  Laterality: Left;   LASIK  2008    There were no vitals filed for this visit.    Subjective Assessment - 04/12/21 1605     Subjective Pt states that the L shoulder has been painful. It has been going on for a few months. He states that the cortisone shot seemed to helped. The day before he saw MD, he felt "stress" within the shoulder blade. He currently has more shoulder blade pain. Current 5/10, Worst 8/10, Best 0/10. He notices it after waking up in the morning. He has pain that is dull that comes down the arm. He states the pain would get better throughout the day. Working out did not seem to bother it. Sharp pains only occured with a certain ROM. Pt  states he sleeps in all positions. Sometimes he will sleep on an elevated head of the bed. Pt has been doing HEP from MD and he feels like he does get some benefit from it. Aggs: reaching, dressing/undressing, Eases: massage, resting, heat, self massage/theracane. 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 He sits most of the day with some standing breaks. He tries to work on work Personal assistant. Pt denies NT. Pt mentions one bout of neck pain about 4 years ago on the L side. He was leaning on floor and the entire L side "locked up."  Working out may cause pain into the elbow. He states he works out at [employer] a couple days a week (curls, bench 120 3x10, triceps, shrugs 35lbs). Pt denies cancer red flags.    Patient Stated Goals Pt states he wants to work out more without pain.    Currently in Pain? Yes    Pain Score 5     Pain Location Shoulder    Pain Orientation Left    Pain Descriptors / Indicators Aching;Sore                [facility] PT Assessment - 04/12/21 0001  Assessment   Medical Diagnosis L shoulder bursitis    Referring Provider (PT) Dr. Georgina Snell    Hand Dominance Right    Prior Therapy N/A      Precautions   Precautions None      Restrictions   Weight Bearing Restrictions No      Balance Screen   Has the patient fallen in the past 6 months No    Has the patient had a decrease in activity level because of a fear of falling?  No    Is the patient reluctant to leave their home because of a fear of falling?  No      Home Ecologist residence      Prior Function   Level of Independence Independent      Cognition   Overall Cognitive Status Within Functional Limits for tasks assessed      Observation/Other Assessments   Other Surveys  Quick Dash    Quick DASH  15.9% 15.9/100      ROM / Strength   AROM / PROM / Strength AROM;PROM;Strength      AROM   Overall AROM Comments L and R symmetrical and WFL; pain with  L IR reach behind back, pain radiating down triceps      PROM   Overall PROM Comments L and R WFL; symmetrical      Strength   Overall Strength Comments R shoulder 4+/5 throughout; L 4/5 ABD with pain, ER 4/5, flexion and IR 4+/5; no myotomal weakness      Palpation   Spinal mobility decreased T/S extension mobility    Palpation comment TTP infra, teres minor, teres major, UT; spasm in periscapulars      Special Tests    Special Tests Cervical;Rotator Cuff Impingement;Biceps/Labral Tests    Cervical Tests Spurling's;Dictraction    Rotator Cuff Impingment tests Neer impingement test;Hawkins- Kennedy test;Empty Can test;Speed's test;Painful Arc of Motion    Biceps/Labral tests Other      Spurling's   Findings Negative      Distraction Test   Findngs Negative      Neer Impingement test    Findings Negative    Comments cavitation      Hawkins-Kennedy test   Findings Negative    Comments cavitation      Empty Can test   Findings Negative      Speed's test   Findings Negative      Painful Arc of Motion   Findings Negative      Crank Test   Findings Negative      other   Findings Negative    Comment Biceps Load II (SLAP)                        Objective measurements completed on examination: See above findings.       [facility] Adult PT Treatment/Exercise - 04/12/21 0001       Exercises   Exercises Shoulder      Shoulder Exercises: Stretch   Other Shoulder Stretches UT stretch with self OP 30s 3x    Other Shoulder Stretches T/S self extension foam roll 3 min; sleeper stretch 30s 3x      Manual Therapy   Manual Therapy Soft tissue mobilization    Soft tissue mobilization L teres minor, infra                    PT Education - 04/12/21 1649  Education Details MOI, diagnosis, prognosis, anatomy, exercise progression, DOMS expectations, muscle firing, HEP, POC    Person(s) Educated Patient    Methods  Explanation;Demonstration;Tactile cues;Verbal cues;Handout    Comprehension Verbalized understanding;Returned demonstration;Verbal cues required;Tactile cues required;Need further instruction              PT Short Term Goals - 04/12/21 1927       PT SHORT TERM GOAL #1   Title Pt will become independent with HEP in order to demonstrate synthesis of PT education.    Time 2    Period Weeks    Status New      PT SHORT TERM GOAL #2   Title Pt will be able to demonstrate IR reach BHB without pain in order to demonstrate functional improvement in UE function for self-care and bathing.    Time 4    Period Weeks    Status New      PT SHORT TERM GOAL #3   Title Pt will be able to reach [facility] and grab/reach/hold >10 lbs in order to demonstrate functional improvement in L UE function.    Time 4    Period Weeks    Status New               PT Long Term Goals - 04/12/21 1930       PT LONG TERM GOAL #1   Title Pt will become independent with final HEP in order to demonstrate synthesis of PT education.    Time 8    Period Weeks    Status New      PT LONG TERM GOAL #2   Title Pt will be able to reach [facility] and grab/reach/hold >40 lbs in order to demonstrate functional improvement for return to regular gym exercise.    Time 8    Period Weeks                    Plan - 04/12/21 1910     Clinical Impression Statement Pt is a 45 y.o. male presenting to PT eval today for CC of L shoulder pain. Pt presents with decreased L shoulder ROM, L shoulder weakness, and periscapular muscle spasm. Pt's s/s appear consistent with shoulder posterior cuff related pain due to history of overuse and weakness. Pt's s/s are minimally irritable and sensitive with particular spasm into infra. Clinical testing does not suggest internal derangement. Pt's impairments limit his ability to participate in regular exercise and household tasks. Pt would benefit from continued skilled therapy in order to reach  goals and maximize functional L UE strength and ROM for full return to PLOF.    Personal Factors and Comorbidities Profession    Examination-Activity Limitations Bathing;Dressing;Lift;Carry;Reach Overhead    Examination-Participation Restrictions Community Activity;Occupation    Stability/Clinical Decision Making Stable/Uncomplicated    Clinical Decision Making Low    Rehab Potential Good    PT Frequency 1x / week   1-2x   PT Duration 12 weeks   likely to D/C by 8   PT Treatment/Interventions ADLs/Self Care Home Management;Electrical Stimulation;Iontophoresis 50m/ml Dexamethasone;Moist Heat;Traction;Ultrasound;Joint Manipulations;Spinal Manipulations;Therapeutic activities;Therapeutic exercise;Neuromuscular re-education;Manual techniques;Passive range of motion;Dry needling    PT Next Visit Plan review HEP, wall angel, foam roll post cuff, counter push up, 3 way band endurance    PT Home Exercise Plan [email or web address]    Consulted and Agree with Plan of Care Patient             Patient will benefit from skilled therapeutic intervention  in order to improve the following deficits and impairments:  Pain,Improper body mechanics,Postural dysfunction,Increased muscle spasms,Hypomobility,Decreased strength,Decreased range of motion,Impaired flexibility,Impaired UE functional use  Visit Diagnosis: Chronic left shoulder pain  Stiffness of left shoulder, not elsewhere classified  Muscle weakness (generalized)     Problem List Patient Active Problem List   Diagnosis Date Noted   Sleep-disordered breathing 10/07/2018   Hypothyroidism 09/29/2014   Testosterone deficiency 09/29/2014   Mixed hyperlipidemia 09/29/2014   Vitamin D deficiency 09/29/2014   Kidney stones 10/27/2013    Daleen Bo PT, DPT 04/12/21 7:45 PM   [facility] [place], [place], [address] Phone: [phone]   Fax:  [phone]  Name: RODRIGUES URBANEK MRN:  [id number] Date of Birth: [birth year]  PHYSICAL THERAPY DISCHARGE SUMMARY  Visits from Start of Care: 1   Plan: Patient to be discharged.  Patient goals were not met and pt status unknown. Patient is being discharged due to not returning since last visit.

## 2021-04-12 NOTE — Patient Instructions (Signed)
Access Code: [email or web address] URL: [email or web address] Date: 04/12/2021 Prepared by: Zebedee Iba  Exercises Seated Upper Trapezius Stretch - 2 x daily - 7 x weekly - 1 sets - 3 reps - 30 hold Sleeper Stretch - 2 x daily - 7 x weekly - 2 sets - 3 reps - 30 hold Thoracic Extension Mobilization on Foam Roll - 2 x daily - 7 x weekly - 1 sets - 1 reps

## 2021-04-19 ENCOUNTER — Encounter: Payer: BC Managed Care – PPO | Admitting: Physical Therapy

## 2021-04-20 DIAGNOSIS — Z20822 Contact with and (suspected) exposure to covid-19: Secondary | ICD-10-CM | POA: Diagnosis not present

## 2021-05-03 ENCOUNTER — Encounter: Payer: BC Managed Care – PPO | Admitting: Physical Therapy

## 2021-05-10 ENCOUNTER — Encounter: Payer: BC Managed Care – PPO | Admitting: Physical Therapy

## 2021-05-15 NOTE — Progress Notes (Deleted)
   I, Christoper Fabian, LAT, ATC, am serving as scribe for Dr. Clementeen Graham.  Ryan Lam is a 45 y.o. male who presents to [facility] at [facility] today for f/u of L shoulder pain.  He was last seen by Dr. Denyse Amass on 04/04/21 and had a L subacromial injection.  He was also referred to PT of which he has completed one visit.  Since his last visit, pt reports   Pertinent review of systems: ***  Relevant historical information: ***   Exam:  There were no vitals taken for this visit. General: Well Developed, well nourished, and in no acute distress.   MSK: ***    Lab and Radiology Results No results found for this or any previous visit (from the past 72 hour(s)). No results found.     Assessment and Plan: 45 y.o. male with ***   PDMP not reviewed this encounter. No orders of the defined types were placed in this encounter.  No orders of the defined types were placed in this encounter.    Discussed warning signs or symptoms. Please see discharge instructions. Patient expresses understanding.   ***

## 2021-05-16 ENCOUNTER — Ambulatory Visit: Payer: BC Managed Care – PPO | Admitting: Family Medicine

## 2021-05-17 ENCOUNTER — Encounter: Payer: BC Managed Care – PPO | Admitting: Physical Therapy

## 2021-06-13 DIAGNOSIS — N430 Encysted hydrocele: Secondary | ICD-10-CM | POA: Diagnosis not present

## 2021-06-13 DIAGNOSIS — Z87442 Personal history of urinary calculi: Secondary | ICD-10-CM | POA: Diagnosis not present

## 2021-06-25 DIAGNOSIS — L821 Other seborrheic keratosis: Secondary | ICD-10-CM | POA: Diagnosis not present

## 2021-06-25 DIAGNOSIS — Z808 Family history of malignant neoplasm of other organs or systems: Secondary | ICD-10-CM | POA: Diagnosis not present

## 2021-06-25 DIAGNOSIS — L814 Other melanin hyperpigmentation: Secondary | ICD-10-CM | POA: Diagnosis not present

## 2021-06-25 DIAGNOSIS — D2339 Other benign neoplasm of skin of other parts of face: Secondary | ICD-10-CM | POA: Diagnosis not present

## 2021-08-13 DIAGNOSIS — N200 Calculus of kidney: Secondary | ICD-10-CM | POA: Diagnosis not present

## 2021-08-13 DIAGNOSIS — N430 Encysted hydrocele: Secondary | ICD-10-CM | POA: Diagnosis not present

## 2021-09-12 ENCOUNTER — Encounter: Payer: Self-pay | Admitting: Family Medicine

## 2021-09-12 DIAGNOSIS — H109 Unspecified conjunctivitis: Secondary | ICD-10-CM | POA: Diagnosis not present

## 2021-10-05 DIAGNOSIS — M9904 Segmental and somatic dysfunction of sacral region: Secondary | ICD-10-CM | POA: Diagnosis not present

## 2021-10-05 DIAGNOSIS — M9902 Segmental and somatic dysfunction of thoracic region: Secondary | ICD-10-CM | POA: Diagnosis not present

## 2021-10-05 DIAGNOSIS — M9903 Segmental and somatic dysfunction of lumbar region: Secondary | ICD-10-CM | POA: Diagnosis not present

## 2021-10-05 DIAGNOSIS — M5417 Radiculopathy, lumbosacral region: Secondary | ICD-10-CM | POA: Diagnosis not present

## 2021-10-08 DIAGNOSIS — M5417 Radiculopathy, lumbosacral region: Secondary | ICD-10-CM | POA: Diagnosis not present

## 2021-10-08 DIAGNOSIS — M9902 Segmental and somatic dysfunction of thoracic region: Secondary | ICD-10-CM | POA: Diagnosis not present

## 2021-10-08 DIAGNOSIS — M9904 Segmental and somatic dysfunction of sacral region: Secondary | ICD-10-CM | POA: Diagnosis not present

## 2021-10-08 DIAGNOSIS — M9903 Segmental and somatic dysfunction of lumbar region: Secondary | ICD-10-CM | POA: Diagnosis not present

## 2021-10-10 DIAGNOSIS — M9903 Segmental and somatic dysfunction of lumbar region: Secondary | ICD-10-CM | POA: Diagnosis not present

## 2021-10-10 DIAGNOSIS — M9902 Segmental and somatic dysfunction of thoracic region: Secondary | ICD-10-CM | POA: Diagnosis not present

## 2021-10-10 DIAGNOSIS — M9904 Segmental and somatic dysfunction of sacral region: Secondary | ICD-10-CM | POA: Diagnosis not present

## 2021-10-10 DIAGNOSIS — M5417 Radiculopathy, lumbosacral region: Secondary | ICD-10-CM | POA: Diagnosis not present

## 2021-11-02 DIAGNOSIS — M5417 Radiculopathy, lumbosacral region: Secondary | ICD-10-CM | POA: Diagnosis not present

## 2021-11-02 DIAGNOSIS — M9903 Segmental and somatic dysfunction of lumbar region: Secondary | ICD-10-CM | POA: Diagnosis not present

## 2021-11-02 DIAGNOSIS — M9904 Segmental and somatic dysfunction of sacral region: Secondary | ICD-10-CM | POA: Diagnosis not present

## 2021-11-02 DIAGNOSIS — M9902 Segmental and somatic dysfunction of thoracic region: Secondary | ICD-10-CM | POA: Diagnosis not present

## 2021-11-09 DIAGNOSIS — M75100 Unspecified rotator cuff tear or rupture of unspecified shoulder, not specified as traumatic: Secondary | ICD-10-CM | POA: Diagnosis not present

## 2021-11-09 DIAGNOSIS — M5417 Radiculopathy, lumbosacral region: Secondary | ICD-10-CM | POA: Diagnosis not present

## 2021-11-09 DIAGNOSIS — M9904 Segmental and somatic dysfunction of sacral region: Secondary | ICD-10-CM | POA: Diagnosis not present

## 2021-11-09 DIAGNOSIS — M9903 Segmental and somatic dysfunction of lumbar region: Secondary | ICD-10-CM | POA: Diagnosis not present

## 2021-11-09 DIAGNOSIS — M9902 Segmental and somatic dysfunction of thoracic region: Secondary | ICD-10-CM | POA: Diagnosis not present

## 2021-11-15 DIAGNOSIS — M75100 Unspecified rotator cuff tear or rupture of unspecified shoulder, not specified as traumatic: Secondary | ICD-10-CM | POA: Diagnosis not present

## 2021-11-15 DIAGNOSIS — M9901 Segmental and somatic dysfunction of cervical region: Secondary | ICD-10-CM | POA: Diagnosis not present

## 2021-11-15 DIAGNOSIS — M531 Cervicobrachial syndrome: Secondary | ICD-10-CM | POA: Diagnosis not present

## 2021-11-15 DIAGNOSIS — M9902 Segmental and somatic dysfunction of thoracic region: Secondary | ICD-10-CM | POA: Diagnosis not present

## 2021-11-20 ENCOUNTER — Emergency Department (HOSPITAL_BASED_OUTPATIENT_CLINIC_OR_DEPARTMENT_OTHER): Payer: BC Managed Care – PPO | Admitting: Radiology

## 2021-11-20 ENCOUNTER — Telehealth: Payer: Self-pay

## 2021-11-20 ENCOUNTER — Encounter (HOSPITAL_BASED_OUTPATIENT_CLINIC_OR_DEPARTMENT_OTHER): Payer: Self-pay

## 2021-11-20 ENCOUNTER — Other Ambulatory Visit: Payer: Self-pay

## 2021-11-20 ENCOUNTER — Emergency Department (HOSPITAL_BASED_OUTPATIENT_CLINIC_OR_DEPARTMENT_OTHER)
Admission: EM | Admit: 2021-11-20 | Discharge: 2021-11-20 | Disposition: A | Discharge status: Home / Self Care | Payer: BC Managed Care – PPO | Attending: Emergency Medicine | Admitting: Emergency Medicine

## 2021-11-20 DIAGNOSIS — R072 Precordial pain: Secondary | ICD-10-CM | POA: Diagnosis not present

## 2021-11-20 DIAGNOSIS — R0789 Other chest pain: Secondary | ICD-10-CM | POA: Diagnosis not present

## 2021-11-20 DIAGNOSIS — R079 Chest pain, unspecified: Secondary | ICD-10-CM

## 2021-11-20 LAB — BASIC METABOLIC PANEL
Anion gap: 6 (ref 5–15)
BUN: 14 mg/dL (ref 6–20)
CO2: 28 mmol/L (ref 22–32)
Calcium: 9.8 mg/dL (ref 8.9–10.3)
Chloride: 103 mmol/L (ref 98–111)
Creatinine, Ser: 0.96 mg/dL (ref 0.61–1.24)
GFR, Estimated: >60 mL/min (ref >60)
Glucose, Bld: 121 mg/dL — ABNORMAL HIGH (ref 70–99)
Potassium: 4.3 mmol/L (ref 3.5–5.1)
Sodium: 137 mmol/L (ref 135–145)

## 2021-11-20 LAB — CBC
HCT: 46.6 % (ref 39.0–52.0)
Hemoglobin: 15.5 g/dL (ref 13.0–17.0)
MCH: 30.8 pg (ref 26.0–34.0)
MCHC: 33.3 g/dL (ref 30.0–36.0)
MCV: 92.6 fL (ref 80.0–100.0)
Platelets: 220 10*3/uL (ref 150–400)
RBC: 5.03 MIL/uL (ref 4.22–5.81)
RDW: 12.8 % (ref 11.5–15.5)
WBC: 6.7 10*3/uL (ref 4.0–10.5)
nRBC: 0 % (ref 0.0–0.2)

## 2021-11-20 LAB — TROPONIN I (HIGH SENSITIVITY): Troponin I (High Sensitivity): <2 ng/L (ref <18)

## 2021-11-20 NOTE — ED Provider Notes (Signed)
[facility] EMERGENCY DEPT Provider Note   CSN: [id number] Arrival date & time: 11/20/21  1154     History  Chief Complaint  Patient presents with   Chest Pain    Ryan Lam is a 46 y.o. male.  46 yo M with a chief complaints of chest pain.  This is left-sided and pinpoint.  Started yesterday afternoon as he laid down to go to bed.  Noted it again this morning when he woke.  Has gotten progressively better throughout the day.  Nothing seems to make it better or worse denies cough congestion or fever denies abdominal pain denies diaphoresis denies nausea or vomiting.   Patient denies history of MI, denies hypertension hyperlipidemia diabetes or smoking.  Denies family history of MI.  Patient denies history of PE or DVT denies hemoptysis denies unilateral lower extremity edema denies recent surgery immobilization hospitalization estrogen use or history of cancer.    The history is provided by the patient.  Chest Pain Pain location:  Substernal area Pain quality: aching   Pain radiates to:  Does not radiate Pain severity:  Moderate Onset quality:  Gradual Duration:  2 days Timing:  Constant Progression:  Worsening Chronicity:  New Relieved by:  Nothing Worsened by:  Nothing Ineffective treatments:  None tried Associated symptoms: no abdominal pain, no fever, no headache, no palpitations, no shortness of breath and no vomiting       Home Medications Prior to Admission medications   Medication Sig Start Date End Date Taking? Authorizing Provider  Cholecalciferol 50 MCG (2000 UT) CAPS Take 5,000 Units by mouth 2 (two) times daily.    Yes [provider]  levothyroxine (SYNTHROID) 100 MCG tablet TAKE ONE TABLET BY MOUTH DAILY 03/12/21  Yes Vivi Barrack, MD  Multiple Vitamins-Minerals (AIRBORNE PO) Take by mouth.   Yes [provider]  Zinc 25 MG TABS Take 50 mg by mouth daily.    Yes [provider]  albuterol (VENTOLIN HFA)  108 (90 Base) MCG/ACT inhaler Inhale 2 puffs into the lungs every 6 (six) hours as needed for wheezing or shortness of breath. Patient not taking: Reported on 11/20/2021 11/08/20   Inda Coke, PA      Allergies    Penicillins    Review of Systems   Review of Systems  Constitutional:  Negative for chills and fever.  HENT:  Negative for congestion and facial swelling.   Eyes:  Negative for discharge and visual disturbance.  Respiratory:  Negative for shortness of breath.   Cardiovascular:  Positive for chest pain. Negative for palpitations.  Gastrointestinal:  Negative for abdominal pain, diarrhea and vomiting.  Musculoskeletal:  Negative for arthralgias and myalgias.  Skin:  Negative for color change and rash.  Neurological:  Negative for tremors, syncope and headaches.  Psychiatric/Behavioral:  Negative for confusion and dysphoric mood.    Physical Exam Updated Vital Signs BP (!) 140/99    Pulse 68    Temp 98.2 F (36.8 C)    Resp 17    Ht 5\' 11"  (1.803 m)    Wt 82.4 kg    SpO2 100%    BMI 25.34 kg/m  Physical Exam Vitals and nursing note reviewed.  Constitutional:      Appearance: He is well-developed.  HENT:     Head: Normocephalic and atraumatic.  Eyes:     Pupils: Pupils are equal, round, and reactive to light.  Neck:     Vascular: No JVD.  Cardiovascular:  Rate and Rhythm: Normal rate and regular rhythm.     Heart sounds: No murmur heard.   No friction rub. No gallop.  Pulmonary:     Effort: No respiratory distress.     Breath sounds: No wheezing.  Chest:     Chest wall: Tenderness present.     Comments: Mild tenderness about the L chest  Abdominal:     General: There is no distension.     Tenderness: There is no abdominal tenderness. There is no guarding or rebound.  Musculoskeletal:        General: Normal range of motion.     Cervical back: Normal range of motion and neck supple.  Skin:    Coloration: Skin is not pale.     Findings: No rash.   Neurological:     Mental Status: He is alert and oriented to person, place, and time.  Psychiatric:        Behavior: Behavior normal.    ED Results / Procedures / Treatments   Labs (all labs ordered are listed, but only abnormal results are displayed) Labs Reviewed  BASIC METABOLIC PANEL - Abnormal; Notable for the following components:      Result Value   Glucose, Bld 121 (*)    All other components within normal limits  CBC  TROPONIN I (HIGH SENSITIVITY)  TROPONIN I (HIGH SENSITIVITY)    EKG EKG Interpretation  Date/Time:  Tuesday November 20 2021 12:32:27 EST Ventricular Rate:  81 PR Interval:  150 QRS Duration: 86 QT Interval:  380 QTC Calculation: 441 R Axis:   65 Text Interpretation: Normal sinus rhythm Normal ECG No previous ECGs available No old tracing to compare Confirmed by Deno Etienne [phone]) on 11/20/2021 3:54:16 PM  Radiology DG Chest 2 View  Result Date: 11/20/2021 CLINICAL DATA:  Chest pain EXAM: CHEST - 2 VIEW COMPARISON:  None. FINDINGS: The cardiomediastinal silhouette is normal. There is no focal consolidation or pulmonary edema. There is no pleural effusion or pneumothorax. There is no acute osseous abnormality. IMPRESSION: No radiographic evidence of acute cardiopulmonary process. Electronically Signed   By: Valetta Mole M.D.   On: 11/20/2021 12:56    Procedures .1-3 Lead EKG Interpretation Performed by: Deno Etienne, DO Authorized by: Deno Etienne, DO     Interpretation: normal     ECG rate:  70   ECG rate assessment: normal     Rhythm: sinus rhythm     Ectopy: none     Conduction: normal      Medications Ordered in ED Medications - No data to display  ED Course/ Medical Decision Making/ A&P                           Medical Decision Making  46 yo M with a chief complaints of left-sided chest pain.  This is atypical in nature and partially reproduced on exam.  He has a troponin that is negative.  Has had mild improvement in his pain since this  morning.  No significant change in 6 hours.  I feel no further work-up for ACS is warranted.  His EKG is nonischemic his chest x-ray is viewed by me without focal show pneumothorax.  No significant electrolyte abnormality no significant anemia.  He is PERC negative I feel no further work-up for PE is warranted.  Most likely his pain is musculoskeletal by history and physical.  We will have him treat as such. Follow-up with his doctor in  the office.  4:56 PM:  I have discussed the diagnosis/risks/treatment options with the patient and believe the pt to be eligible for discharge home to follow-up with PCP. We also discussed returning to the ED immediately if new or worsening sx occur. We discussed the sx which are most concerning (e.g., sudden worsening pain, fever, inability to tolerate by mouth, exertional symptoms, hemoptysis, syncope) that necessitate immediate return. Medications administered to the patient during their visit and any new prescriptions provided to the patient are listed below.  Medications given during this visit Medications - No data to display   The patient appears reasonably screen and/or stabilized for discharge and I doubt any other medical condition or other [facility] requiring further screening, evaluation, or treatment in the ED at this time prior to discharge.          Final Clinical Impression(s) / ED Diagnoses Final diagnoses:  Nonspecific chest pain    Rx / DC Orders ED Discharge Orders     None         Deno Etienne, DO 11/20/21 1656

## 2021-11-20 NOTE — Discharge Instructions (Signed)
Take 4 over the counter ibuprofen tablets 3 times a day or 2 over-the-counter naproxen tablets twice a day for pain. Also take tylenol 1000mg (2 extra strength) four times a day.   Please return for worsening pain pain on exertion shortness of breath coughing up blood or if you feel like you are going to pass out or pass out.

## 2021-11-20 NOTE — Telephone Encounter (Signed)
Patient Name: Ryan Lam Gender: Male DOB: Nov 18, 1976 Age: 46 Y 2 M 28 D Return Phone Number: [phone] (Primary) Address: City/ State/ Zip: [place]  [address] Client [facility] Client Site [facility] [place] [facility] Provider Dimas Chyle- MD Contact Type Call Who Is Calling Patient / Member / Family / Caregiver Call Type Triage / Clinical Relationship To Patient Self Return Phone Number [phone] (Primary) Chief Complaint Heart palpitations or irregular heartbeat Reason for Call Symptomatic / Request for [name] states she has a patient that needs to be triaged. Caller states symptoms started last night. He had difficulty resting. Had heart palpitations last night and then felt the same way again this morning. Translation No Nurse Assessment Nurse: [name], RN, [name] Date/Time (Eastern Time): 11/20/2021 10:38:55 AM Confirm and document reason for call. If symptomatic, describe symptoms. ---Had heart palpitations last night and then felt the same way again this morning. Chest discomfort. 166/106 HR 78 Does the patient have any new or worsening symptoms? ---Yes Will a triage be completed? ---Yes Related visit to physician within the last 2 weeks? ---No Does the PT have any chronic conditions? (i.e. diabetes, asthma, this includes High risk factors for pregnancy, etc.) ---Yes List chronic conditions. ---hypothyroid Is this a behavioral health or substance abuse call? ---No Guidelines Guideline Title Affirmed Question Affirmed Notes Nurse Date/Time (Eastern Time) Blood Pressure - High [id number] Systolic BP >= 0000000 OR Diastolic >= [id number] AND [id number] cardiac or neurologic symptoms (e.g., chest pain, difficulty [name], RN, [name] 11/20/2021 10:46:2  Guidelines Guideline Title Affirmed Question Affirmed Notes Nurse Date/Time [name] Time) breathing, unsteady gait,  blurred vision) Disp. Time [name] Time) Disposition Final User 11/20/2021 10:49:10 AM Go to ED Now Yes [name], RN, [name] Caller Disagree/Comply Comply Caller Understands Yes PreDisposition Call Doctor Care Advice Given Per Guideline GO TO ED NOW: * You need to be seen in the Emergency Department. CALL EMS 911 IF: * Passes out or faints * Becomes too weak to stand * Becomes confused * You become worse CARE ADVICE given per High Blood Pressure (Adult) guideline. Referrals GO TO FACILITY UNDECIDED

## 2021-11-20 NOTE — ED Triage Notes (Signed)
Patient here POV from Home with CP and HTN.  Pain is Left-Sided Chest. No SOB. No Radiation. No Confirmed History of HTN. No HTN Medications.  Patient states he began having CP last PM and checked BP this AM and obtained a BP of 166/106.  NAD Noted during Triage. A&Ox4. GCS 15. Ambulatory.

## 2021-11-20 NOTE — ED Notes (Signed)
Patient verbalizes understanding of discharge instructions. Opportunity for questioning and answers were provided. Patient discharged from ED.  °

## 2021-11-21 NOTE — Telephone Encounter (Signed)
Fyi.

## 2021-11-22 ENCOUNTER — Ambulatory Visit: Payer: BC Managed Care – PPO | Admitting: Physician Assistant

## 2021-11-22 ENCOUNTER — Other Ambulatory Visit: Payer: Self-pay

## 2021-11-22 VITALS — BP 149/91 | HR 79 | Temp 98.0°F | Ht 71.0 in | Wt 183.0 lb

## 2021-11-22 DIAGNOSIS — I10 Essential (primary) hypertension: Secondary | ICD-10-CM

## 2021-11-22 DIAGNOSIS — R0789 Other chest pain: Secondary | ICD-10-CM

## 2021-11-22 DIAGNOSIS — Z13220 Encounter for screening for lipoid disorders: Secondary | ICD-10-CM

## 2021-11-22 DIAGNOSIS — Z131 Encounter for screening for diabetes mellitus: Secondary | ICD-10-CM

## 2021-11-22 DIAGNOSIS — E039 Hypothyroidism, unspecified: Secondary | ICD-10-CM

## 2021-11-22 LAB — COMPREHENSIVE METABOLIC PANEL
ALT: 43 U/L (ref 0–53)
AST: 36 U/L (ref 0–37)
Albumin: 4.2 g/dL (ref 3.5–5.2)
Alkaline Phosphatase: 69 U/L (ref 39–117)
BUN: 14 mg/dL (ref 6–23)
CO2: 29 mEq/L (ref 19–32)
Calcium: 9.2 mg/dL (ref 8.4–10.5)
Chloride: 104 mEq/L (ref 96–112)
Creatinine, Ser: 1.04 mg/dL (ref 0.40–1.50)
GFR: 86.88 mL/min (ref >60.00)
Glucose, Bld: 89 mg/dL (ref 70–99)
Potassium: 4.3 mEq/L (ref 3.5–5.1)
Sodium: 139 mEq/L (ref 135–145)
Total Bilirubin: 0.8 mg/dL (ref 0.2–1.2)
Total Protein: 7.1 g/dL (ref 6.0–8.3)

## 2021-11-22 LAB — CBC WITH DIFFERENTIAL/PLATELET
Basophils Absolute: 0 10*3/uL (ref 0.0–0.1)
Basophils Relative: 0.9 % (ref 0.0–3.0)
Eosinophils Absolute: 0.1 10*3/uL (ref 0.0–0.7)
Eosinophils Relative: 1.1 % (ref 0.0–5.0)
HCT: 46.1 % (ref 39.0–52.0)
Hemoglobin: 15.3 g/dL (ref 13.0–17.0)
Lymphocytes Relative: 23.9 % (ref 12.0–46.0)
Lymphs Abs: 1.3 10*3/uL (ref 0.7–4.0)
MCHC: 33.3 g/dL (ref 30.0–36.0)
MCV: 93.6 fl (ref 78.0–100.0)
Monocytes Absolute: 0.5 10*3/uL (ref 0.1–1.0)
Monocytes Relative: 8.1 % (ref 3.0–12.0)
Neutro Abs: 3.7 10*3/uL (ref 1.4–7.7)
Neutrophils Relative %: 66 % (ref 43.0–77.0)
Platelets: 208 10*3/uL (ref 150.0–400.0)
RBC: 4.92 Mil/uL (ref 4.22–5.81)
RDW: 13.5 % (ref 11.5–15.5)
WBC: 5.6 10*3/uL (ref 4.0–10.5)

## 2021-11-22 LAB — LIPID PANEL
Cholesterol: 156 mg/dL (ref 0–200)
HDL: 52 mg/dL (ref >39.00)
LDL Cholesterol: 83 mg/dL (ref 0–99)
NonHDL: 103.55
Total CHOL/HDL Ratio: 3
Triglycerides: 104 mg/dL (ref 0.0–149.0)
VLDL: 20.8 mg/dL (ref 0.0–40.0)

## 2021-11-22 LAB — T4, FREE: Free T4: 1.02 ng/dL (ref 0.60–1.60)

## 2021-11-22 LAB — HEMOGLOBIN A1C: Hgb A1c MFr Bld: 5.1 % (ref 4.6–6.5)

## 2021-11-22 LAB — TSH: TSH: 2.44 u[IU]/mL (ref 0.35–5.50)

## 2021-11-22 NOTE — Patient Instructions (Addendum)
*  Stop all alcohol *Low sodium diet *Increase cardio exercise *Increase water intake, at least 64-80 oz daily  *Monitor BP and bring log to next appt. F/up with Dr. [name] in 4-6 weeks.   ED if any recurrent chest pain

## 2021-11-22 NOTE — Progress Notes (Signed)
Subjective:    Patient ID: Ryan Lam, male    DOB: Oct 28, 1976, 46 y.o.   MRN: [id number]  Chief Complaint  Patient presents with   Hypertension    HPI Patient is in today for ED f/up from 11/20/2021.  Apparently he reported that he was having left-sided pinpoint chest pain that started on 11/19/2021 and then again the morning of 11/20/2021 when he woke up.  Nothing seemed to make it better or worse.  He described it as aching in the substernal area.  Pain did not radiate it was moderate in intensity.  Came on gradually and seem to be worsening enough to the point where he needed to go to the ER.  He did not have any associated abdominal pain fever headache palpitation shortness of breath or vomiting.  His troponin was negative and his pain had improved.  EKG was normal chest x-ray was normal.  No significant findings on his labs.  He was told to follow-up with PCP and discharged in stable condition.  He was told it was possibly muscular, structural, or reflux.  He was also told that he needs to get his elevated blood pressure addressed.  No hx of smoking, vaping, marijuana or other drug use.   Drinks alcohol about 4-5 days per week. He did engage in heavier drinking over [place] holiday.  He has switch to [employer]. Exercise is minimal due to some recent injuries.   Recently on a trip and he was off his levothyroxine about 2 days. Wonders if his thyroid level was off and affected him as well.    Past Medical History:  Diagnosis Date   Chest pain 01/16/2019   to see dr Jerline Pain for 01-18-2019 or 01-19-2019   GERD (gastroesophageal reflux disease)    hx of   History of kidney stones    Hypothyroidism    Left inguinal hernia    Sleep apnea    osa getting dental device    Past Surgical History:  Procedure Laterality Date   chelazon removed from eye Left 2019   INGUINAL HERNIA REPAIR Left 01/20/2019   Procedure: LEFT OPEN HERNIA REPAIR INGUINAL  WITH MESH;  Surgeon: Kinsinger, Arta Bruce, MD;  Location: [facility];  Service: General;  Laterality: Left;   LASIK  2008    Family History  Problem Relation Age of Onset   Cancer Mother        hx breast   COPD Father    AAA (abdominal aortic aneurysm) Father    Diabetes Maternal Grandmother    Stroke Maternal Grandfather     Social History   Tobacco Use   Smoking status: Never   Smokeless tobacco: Never  Vaping Use   Vaping Use: Never used  Substance Use Topics   Alcohol use: Yes    Alcohol/week: 10.0 standard drinks    Types: 10 Standard drinks or equivalent per week    Comment: 2-3 drinks 5 days week   Drug use: No     Allergies  Allergen Reactions   Penicillins     Childhood reaction hives    Review of Systems NEGATIVE UNLESS OTHERWISE INDICATED IN HPI      Objective:     BP (!) 149/91    Pulse 79    Temp 98 F (36.7 C)    Ht 5\' 11"  (1.803 m)    Wt 183 lb (83 kg)    SpO2 99%    BMI 25.52 kg/m  Wt Readings from Last 3 Encounters:  11/22/21 183 lb (83 kg)  11/20/21 181 lb 10.5 oz (82.4 kg)  04/04/21 181 lb 9.6 oz (82.4 kg)    BP Readings from Last 3 Encounters:  11/22/21 (!) 149/91  11/20/21 (!) 140/99  04/04/21 (!) 138/96     Physical Exam Vitals and nursing note reviewed.  Constitutional:      General: He is not in acute distress.    Appearance: Normal appearance. He is not toxic-appearing.  HENT:     Head: Normocephalic and atraumatic.     Right Ear: External ear normal.     Left Ear: External ear normal.     Nose: Nose normal.     Mouth/Throat:     Mouth: Mucous membranes are moist.     Pharynx: Oropharynx is clear.  Eyes:     Extraocular Movements: Extraocular movements intact.     Conjunctiva/sclera: Conjunctivae normal.     Pupils: Pupils are equal, round, and reactive to light.  Cardiovascular:     Rate and Rhythm: Normal rate and regular rhythm.     Pulses: Normal pulses.     Heart sounds: Normal heart sounds.  Pulmonary:     Effort: Pulmonary  effort is normal.     Breath sounds: Normal breath sounds.  Musculoskeletal:        General: Normal range of motion.     Cervical back: Normal range of motion and neck supple.  Skin:    General: Skin is warm and dry.  Neurological:     General: No focal deficit present.     Mental Status: He is alert and oriented to person, place, and time.  Psychiatric:        Mood and Affect: Mood normal.        Behavior: Behavior normal.       Assessment & Plan:   Problem List Items Addressed This Visit       Endocrine   Hypothyroidism   Relevant Orders   TSH (Completed)   T4, free (Completed)   Other Visit Diagnoses     Essential hypertension    -  Primary   Relevant Orders   CBC with Differential/Platelet (Completed)   Comprehensive metabolic panel (Completed)   Lipid panel (Completed)   Other chest pain       Relevant Orders   CBC with Differential/Platelet (Completed)   Comprehensive metabolic panel (Completed)   Diabetes mellitus screening       Relevant Orders   Comprehensive metabolic panel (Completed)   Hemoglobin A1c (Completed)   Screening for cholesterol level       Relevant Orders   Lipid panel (Completed)       Plan: -I personally reviewed patient's ED report, as well as labs and imaging. -Patient is not having any symptoms today. -Discussed with patient several possibilities for his event including this being related to his alcohol intake. -Plan to recheck some labs today including TSH.  As he is no longer having any chest pain, we do not need to repeat EKG. -His blood pressure is elevated, but he wants to try to manage this conservatively. -Highly emphasized to patient that he needs to stop all alcohol consumption and work on a lower sodium diet.  He does need to slowly increase his cardio exercise on a weekly basis and also increase his water intake. - He needs to have very close follow-up on this with his PCP Dr. Jerline Pain in the next 4 to 6 weeks.  I  encouraged him to work on these things and monitor his blood pressure at home, bring his log to next appointment. -Return precautions to the ED discussed with patient.   This note was prepared with assistance of Systems analyst. Occasional wrong-word or sound-a-like substitutions may have occurred due to the inherent limitations of voice recognition software.  Time Spent: 32 minutes of total time was spent on the date of the encounter performing the following actions: chart review prior to seeing the patient, obtaining history, performing a medically necessary exam, counseling on the treatment plan, placing orders, and documenting in our EHR.    Norrin Shreffler M Harriett Azar, PA-C

## 2021-11-23 ENCOUNTER — Encounter: Payer: Self-pay | Admitting: Family Medicine

## 2021-11-23 NOTE — Telephone Encounter (Signed)
See note

## 2021-11-26 NOTE — Telephone Encounter (Signed)
See note

## 2021-11-27 ENCOUNTER — Telehealth (INDEPENDENT_AMBULATORY_CARE_PROVIDER_SITE_OTHER): Payer: BC Managed Care – PPO | Admitting: Family Medicine

## 2021-11-27 ENCOUNTER — Other Ambulatory Visit: Payer: Self-pay

## 2021-11-27 ENCOUNTER — Encounter: Payer: Self-pay | Admitting: Family Medicine

## 2021-11-27 DIAGNOSIS — R0789 Other chest pain: Secondary | ICD-10-CM | POA: Diagnosis not present

## 2021-11-27 DIAGNOSIS — G4733 Obstructive sleep apnea (adult) (pediatric): Secondary | ICD-10-CM

## 2021-11-27 DIAGNOSIS — R030 Elevated blood-pressure reading, without diagnosis of hypertension: Secondary | ICD-10-CM

## 2021-11-27 DIAGNOSIS — M75100 Unspecified rotator cuff tear or rupture of unspecified shoulder, not specified as traumatic: Secondary | ICD-10-CM | POA: Diagnosis not present

## 2021-11-27 DIAGNOSIS — J343 Hypertrophy of nasal turbinates: Secondary | ICD-10-CM | POA: Diagnosis not present

## 2021-11-27 DIAGNOSIS — R002 Palpitations: Secondary | ICD-10-CM | POA: Diagnosis not present

## 2021-11-27 NOTE — Assessment & Plan Note (Signed)
Seems to be worsening.  He is not on CPAP due to mild nature of OSA on previous testing.  We will repeat sleep study.

## 2021-11-27 NOTE — Progress Notes (Signed)
Ryan Lam is a 46 y.o. male who presents today for a virtual office visit.  Assessment/Plan:  New/Acute Problems: Palpitations Discussed limitations of virtual visit and inability perform physical exam.  I am not able to review his EKG in the hospital however report states that he was in normal sinus rhythm with no ectopic beats.  It is very possible he could be having PVCs or PACs explaining his symptoms.  We will check Zio patch to further evaluate.  Recently had labs which were all within normal limits.  Chest pain Resolved.  Possibly musculoskeletal.  It is also possible he could have some reflux symptoms.  He did have some indigestion a few days ago which resolved with Pepcid.  May consider trial of PPI if this persists.  Elevated blood pressure readings He has typically been well controlled.  He has been elevated recently which could be due to anxiety.  We will continue monitoring for now with goal 140/90 or lower.  It is possible his sleep apnea as below could be contributing.  We discussed importance of salt avoidance.  Discussed reasons to return to care.  Chronic Problems Addressed Today: Nasal turbinate hypertrophy Potentially contributing to his OSA.  He will repeat sleep study as below.  If OSA is worsening may consider referral to ENT for further relation management.  OSA (obstructive sleep apnea) Seems to be worsening.  He is not on CPAP due to mild nature of OSA on previous testing.  We will repeat sleep study.     Subjective:  HPI:  Patient here for ED follow up. Went to the ED on 11/20/2021 with chest pain. Had work up including CXR, EKG, and labs which were reassuring. PE ruled out via [facility]. It was thought that his pain was musculoskeletal in etiology and he was discharged home.  Over last couple days he has done okay.  Still has issues with recurrent palpitations.  This occurred mostly at night.  Chest pain has mostly resolved.  No reported shortness of breath or  exertion.  He feels like his heart beats rapidly and then subsides.  Does not have any symptoms throughout the day.  He is also concerned about worsening sleep apnea.  He wears a apple watch and is concerned about his worsening quality of sleep.  He has been diagnosed with mild OSA in the past he was not prescribed CPAP.  He was given a dental appliance by his dentist.  He has also had some issues with breathing through his nose.  This is been going on for quite a while but seems to have worsened within the last few weeks.       Objective/Observations  Physical Exam: Gen: NAD, resting comfortably Pulm: Normal work of breathing Neuro: Grossly normal, moves all extremities Psych: Normal affect and thought content  Virtual Visit via Video   I connected with Ryan Lam on 11/27/21 at  4:00 PM EST by a video enabled telemedicine application and verified that I am speaking with the correct person using two identifiers. The limitations of evaluation and management by telemedicine and the availability of in person appointments were discussed. The patient expressed understanding and agreed to proceed.   Patient location: Home Provider location: [place] [employer] Persons participating in the virtual visit: Myself and Patient  Time Spent: 45 minutes of total time was spent on the date of the encounter performing the following actions: chart review prior to seeing the patient including recent ED visit,  obtaining history, performing a medically necessary exam, counseling on the treatment plan, placing orders, and documenting in our EHR.       [name]. [name], MD 11/27/2021 4:26 PM

## 2021-11-27 NOTE — Assessment & Plan Note (Signed)
Potentially contributing to his OSA.  He will repeat sleep study as below.  If OSA is worsening may consider referral to ENT for further relation management.

## 2021-11-28 ENCOUNTER — Ambulatory Visit (INDEPENDENT_AMBULATORY_CARE_PROVIDER_SITE_OTHER): Payer: BC Managed Care – PPO

## 2021-11-28 DIAGNOSIS — R002 Palpitations: Secondary | ICD-10-CM

## 2021-11-28 NOTE — Progress Notes (Unsigned)
Enrolled patient for a 7 day Zio XT monitor to be mailed to patients home.  

## 2021-11-30 DIAGNOSIS — R002 Palpitations: Secondary | ICD-10-CM | POA: Diagnosis not present

## 2021-12-20 ENCOUNTER — Ambulatory Visit: Payer: BC Managed Care – PPO | Admitting: Family Medicine

## 2021-12-25 DIAGNOSIS — M25512 Pain in left shoulder: Secondary | ICD-10-CM | POA: Diagnosis not present

## 2021-12-25 DIAGNOSIS — R002 Palpitations: Secondary | ICD-10-CM | POA: Diagnosis not present

## 2021-12-27 NOTE — Progress Notes (Signed)
Please inform patient of the following:  His CT scan showed rare premature beats but was otherwise normal. Premature beats are benign and do not need any further testing. Would like for him to let us know if his symptoms change or become more bothersome.  Ryan Lam. Jerline Pain, MD 12/27/2021 9:53 AM

## 2021-12-28 ENCOUNTER — Encounter: Payer: Self-pay | Admitting: Family Medicine

## 2021-12-28 ENCOUNTER — Other Ambulatory Visit: Payer: Self-pay

## 2021-12-28 ENCOUNTER — Ambulatory Visit: Payer: BC Managed Care – PPO | Admitting: Family Medicine

## 2021-12-28 VITALS — BP 151/89 | HR 70 | Temp 98.1°F | Ht 71.0 in | Wt 178.8 lb

## 2021-12-28 DIAGNOSIS — E039 Hypothyroidism, unspecified: Secondary | ICD-10-CM | POA: Diagnosis not present

## 2021-12-28 DIAGNOSIS — R002 Palpitations: Secondary | ICD-10-CM | POA: Diagnosis not present

## 2021-12-28 NOTE — Assessment & Plan Note (Signed)
Rare ectopic beats on Holter monitor.  Symptoms are not currently bothersome.  We will continue with watchful waiting.  He will let me know if any new symptoms arise.  We discussed starting a beta-blocker however he declined for today.

## 2021-12-28 NOTE — Assessment & Plan Note (Signed)
Last TSH at goal on Synthroid 100 mcg daily.

## 2021-12-28 NOTE — Progress Notes (Signed)
° °  Ryan Lam is a 46 y.o. male who presents today for an office visit.  Assessment/Plan:  New/Acute Problems: Elevated blood pressure reading Slightly elevated today.  He has been up for the last few weeks.  Normally well controlled.  We will continue home monitoring goal 140/90 or lower.  We discussed lifestyle modifications including regular exercise and low-sodium diet.  He will let me know if persistently elevated.  Chronic Problems Addressed Today: Hypothyroidism Last TSH at goal on Synthroid 100 mcg daily.  Palpitation Rare ectopic beats on Holter monitor.  Symptoms are not currently bothersome.  We will continue with watchful waiting.  He will let me know if any new symptoms arise.  We discussed starting a beta-blocker however he declined for today.     Subjective:  HPI:  See A/P for status of chronic conditions.  Patient had a virtual visit about a month ago.  Was having some palpitations.  We obtained Holter monitor.  Did not have any arrhythmias.  He had rare ectopic beats.  Symptoms have improved.      Objective:  Physical Exam: BP (!) 151/89    Pulse 70    Temp 98.1 F (36.7 C) (Temporal)    Ht 5\' 11"  (1.803 m)    Wt 178 lb 12.8 oz (81.1 kg)    SpO2 98%    BMI 24.94 kg/m   Gen: No acute distress, resting comfortably CV: Regular rate and rhythm with no murmurs appreciated Pulm: Normal work of breathing, clear to auscultation bilaterally with no crackles, wheezes, or rhonchi Neuro: Grossly normal, moves all extremities Psych: Normal affect and thought content      Ryan Lam M. Jerline Pain, MD 12/28/2021 3:46 PM

## 2021-12-28 NOTE — Patient Instructions (Signed)
It was very nice to see you today!  Please keep an eye on your blood pressure and let me know if it is persistently 140/90 or higher.  Please continue to work on diet and exercise.  Take care, Dr Jerline Pain  PLEASE NOTE:  If you had any lab tests please let us know if you have not heard back within a few days. You may see your results on mychart before we have a chance to review them but we will give you a call once they are reviewed by [name]. If we ordered any referrals today, please let us know if you have not heard from their office within the next week.   Please try these tips to maintain a healthy lifestyle:  Eat at least 3 REAL meals and 1-2 snacks per day.  Aim for no more than 5 hours between eating.  If you eat breakfast, please do so within one hour of getting up.   Each meal should contain half fruits/vegetables, one quarter protein, and one quarter carbs (no bigger than a computer mouse)  Cut down on sweet beverages. This includes juice, soda, and sweet tea.   Drink at least 1 glass of water with each meal and aim for at least 8 glasses per day  Exercise at least 150 minutes every week.

## 2022-03-31 IMAGING — US US SCROTUM W/ DOPPLER COMPLETE
1 series · 14 of 25 positions shown · non-contrast
Comparison: July 13, 2019

CLINICAL DATA: Hematuria. Testicle pain. Left inguinal hernia
repair 2 years ago.

EXAM:
SCROTAL ULTRASOUND
DOPPLER ULTRASOUND OF THE TESTICLES
TECHNIQUE: Complete ultrasound examination of the testicles, epididymis, and
other scrotal structures was performed. Color and spectral Doppler
ultrasound were also utilized to evaluate blood flow to the
testicles.

[Series 1: us scrotum w/doppler · 14 of 65 slices shown]
[im 1/65]
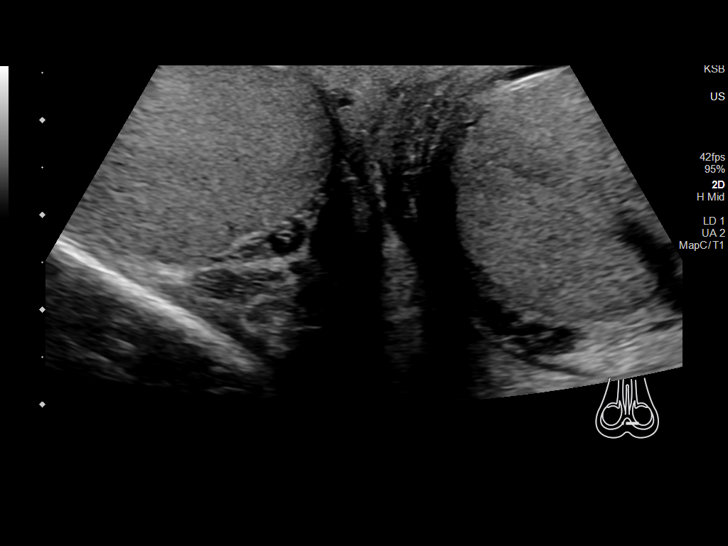
[im 6/65]
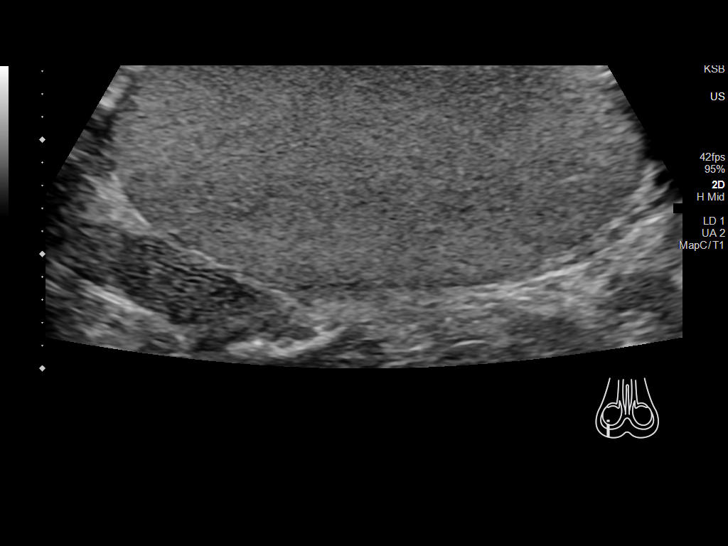
[im 11/65]
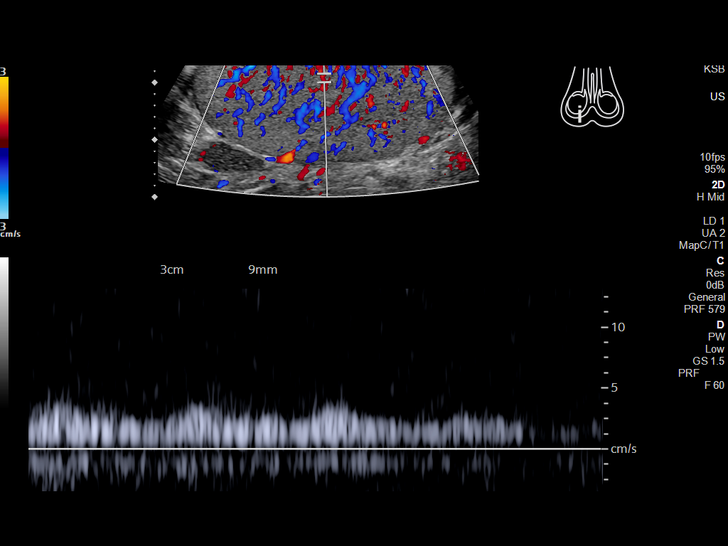
[im 17/65]
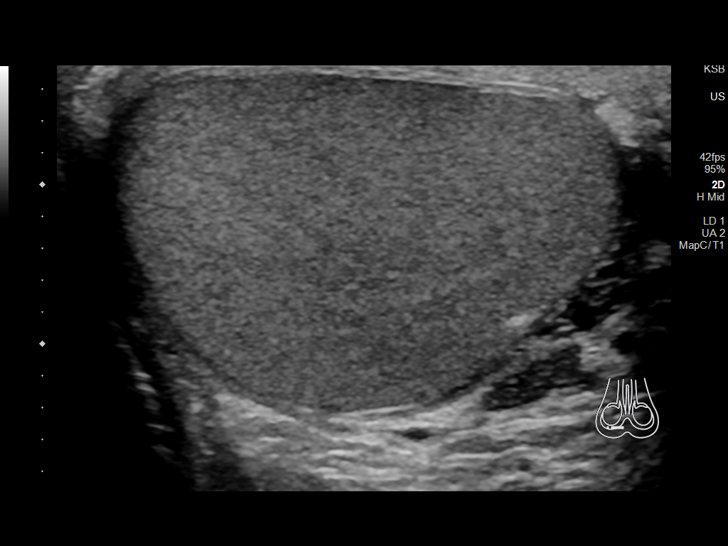
[im 22/65]
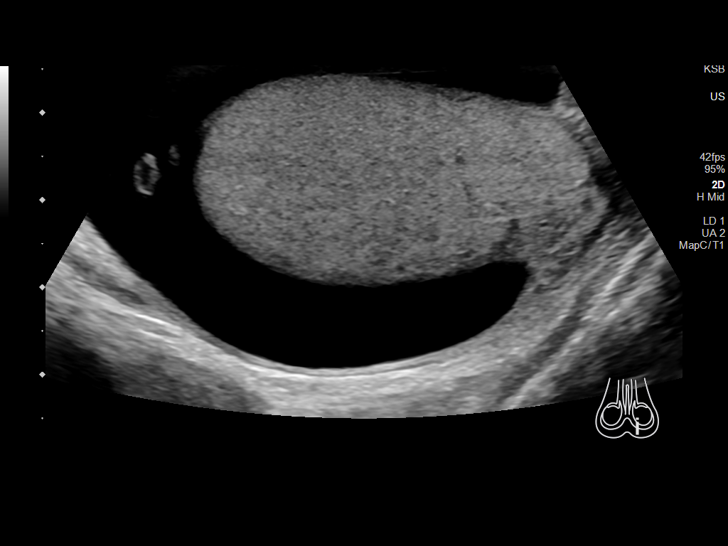
[im 25/65]
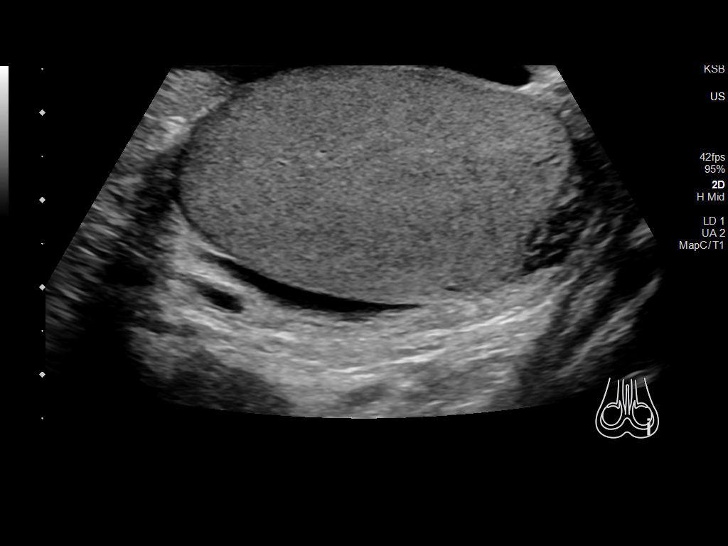
[im 30/65]
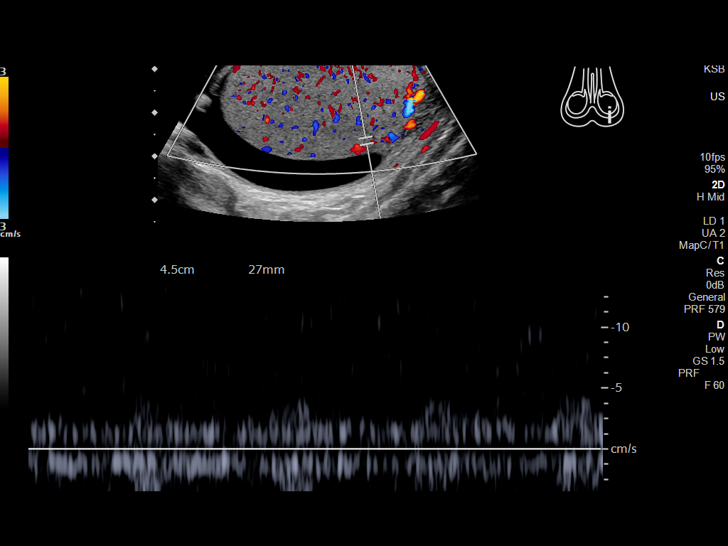
[im 35/65]
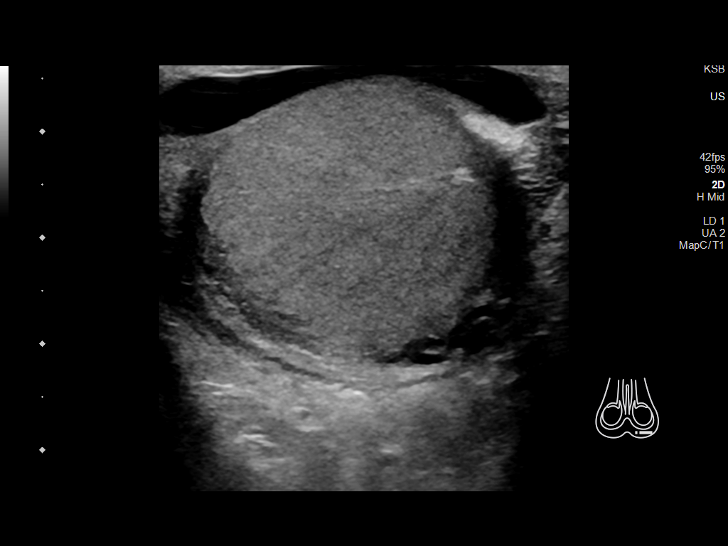
[im 41/65]
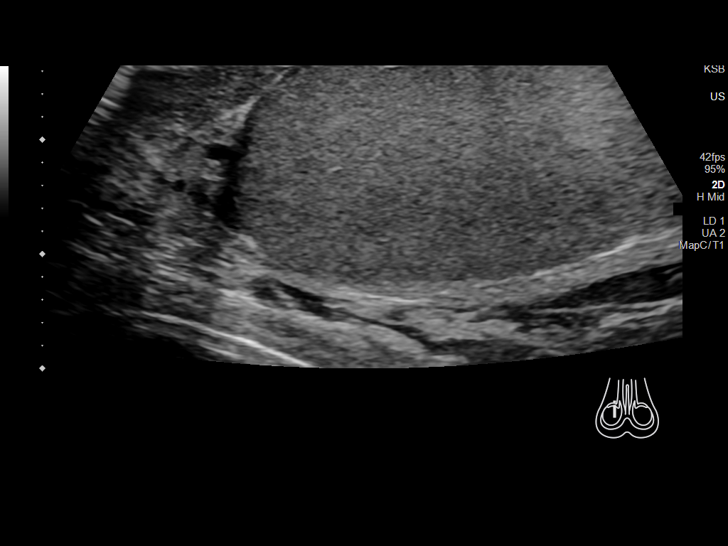
[im 43/65]
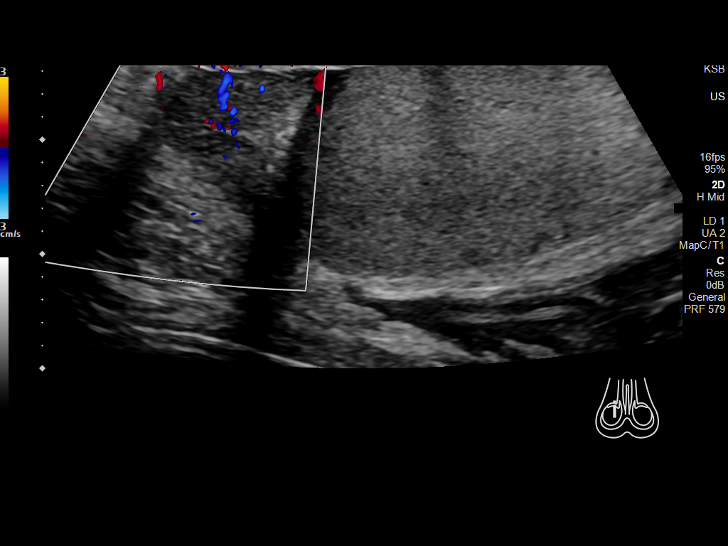
[im 49/65]
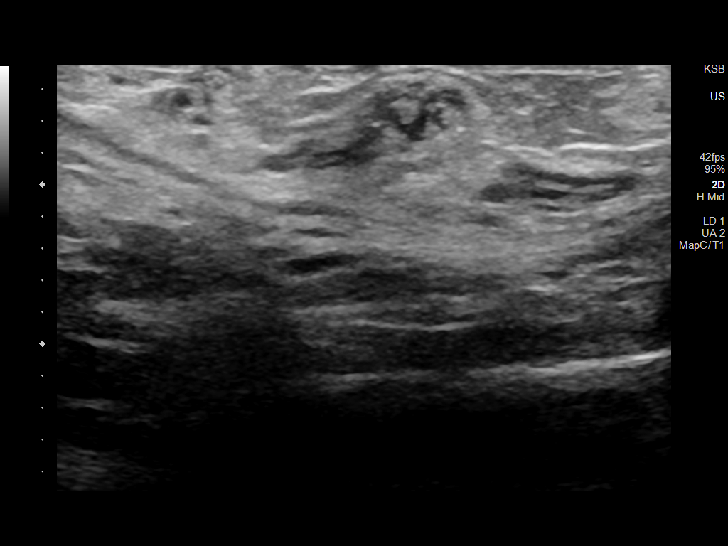
[im 54/65]
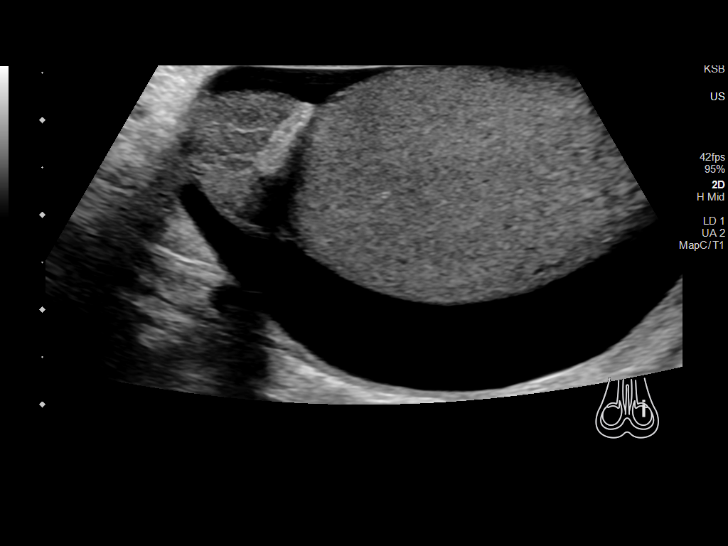
[im 59/65]
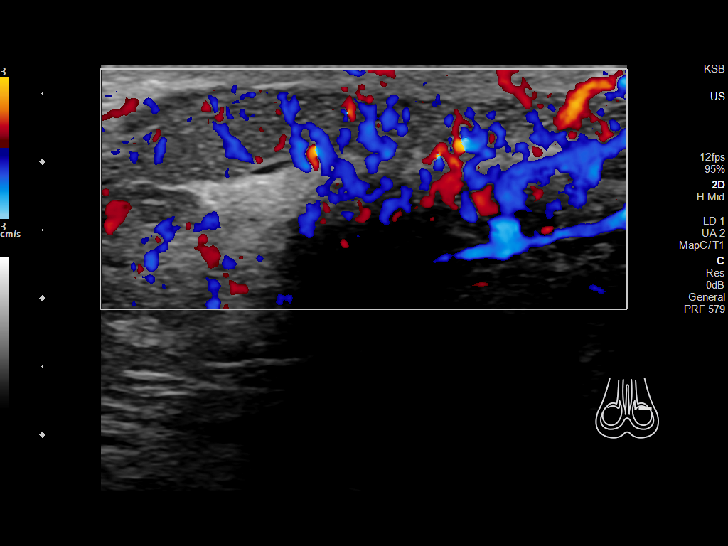
[im 65/65]
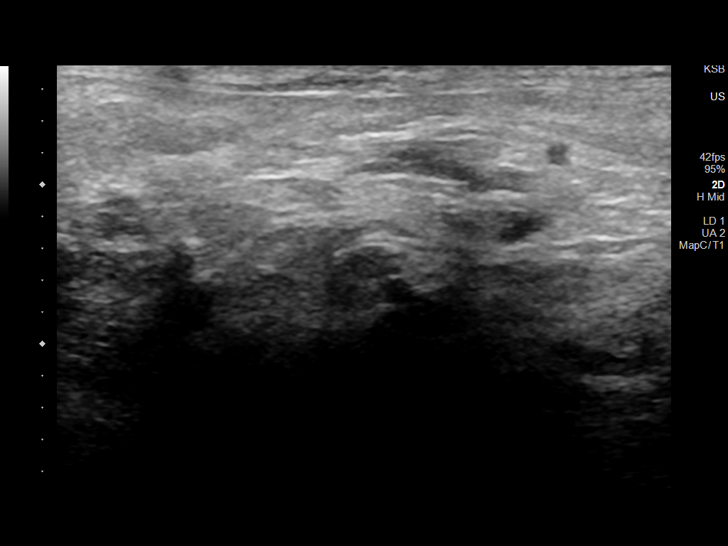

[14 of 25 positions shown; findings below may reference images not displayed]

FINDINGS: Right testicle

Measurements: 4.6 x 2.2 x 3.2 cm. No mass or microlithiasis
visualized.

Left testicle

Measurements: 4.8 x 2.8 x 2.9 cm. No mass or microlithiasis
visualized.

Right epididymis:  Normal in size and appearance.

Left epididymis:  Normal in size and appearance.

Hydrocele:  There is a moderate to large left-sided hydrocele.

Varicocele:  None visualized.

Pulsed Doppler interrogation of both testes demonstrates normal low
resistance arterial and venous waveforms bilaterally.

There appears to be a fat containing left hernia.
IMPRESSION: 1. No evidence for testicular torsion.
2. No testicular mass.
3. Large left-sided hydrocele.
4. Findings suspicious for a left-sided fat containing inguinal
hernia.

## 2022-03-31 IMAGING — US US EXTREM LOW*L* LIMITED
1 series · 14 of 14 positions shown · non-contrast
Comparison: None.

CLINICAL DATA: History of left inguinal hernia repair. Pain near
the site of surgery inferior to the scrotum.

EXAM:
ULTRASOUND LEFT LOWER EXTREMITY LIMITED
TECHNIQUE: Ultrasound examination of the lower extremity soft tissues was
performed in the area of clinical concern.

[Series 1: us left lower extrem ltd soft tissue non vascular · 14 acquisitions, 14 frames shown]
[im 1/14]
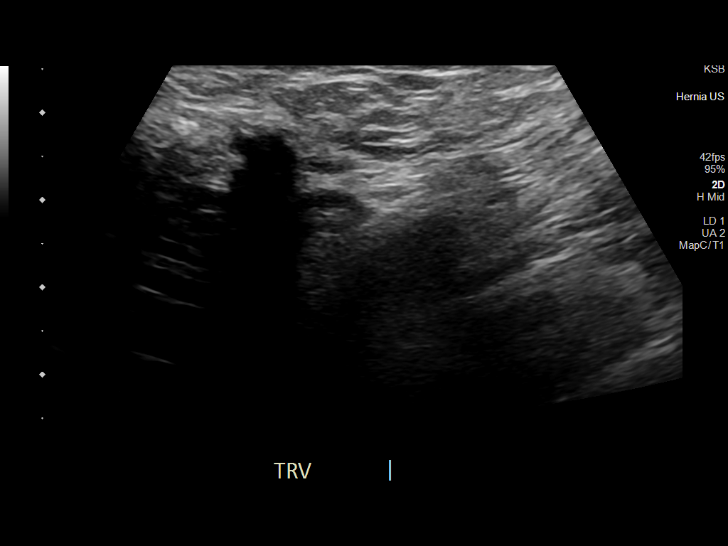
[im 2/14]
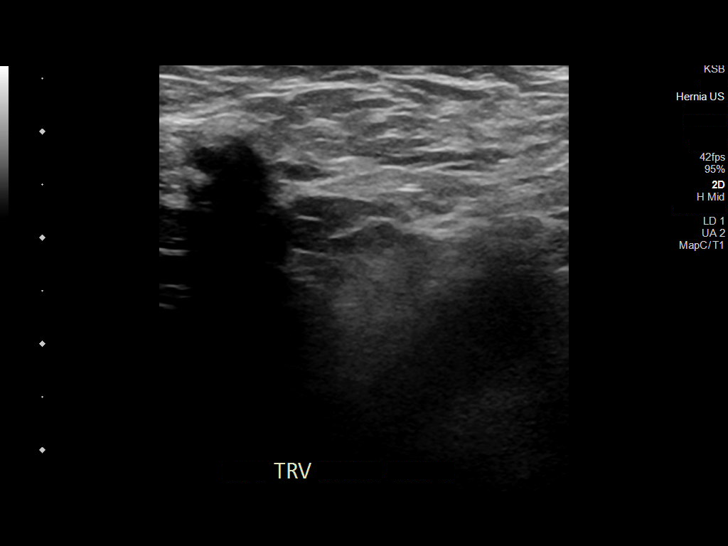
[im 3/14]
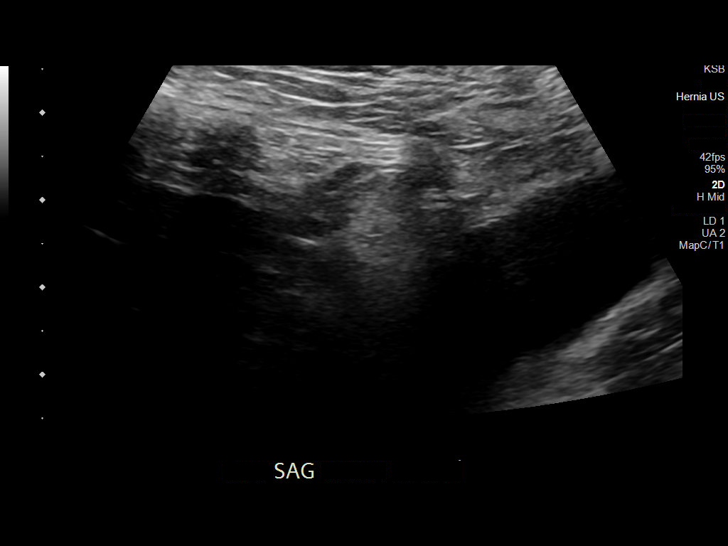
[im 4/14]
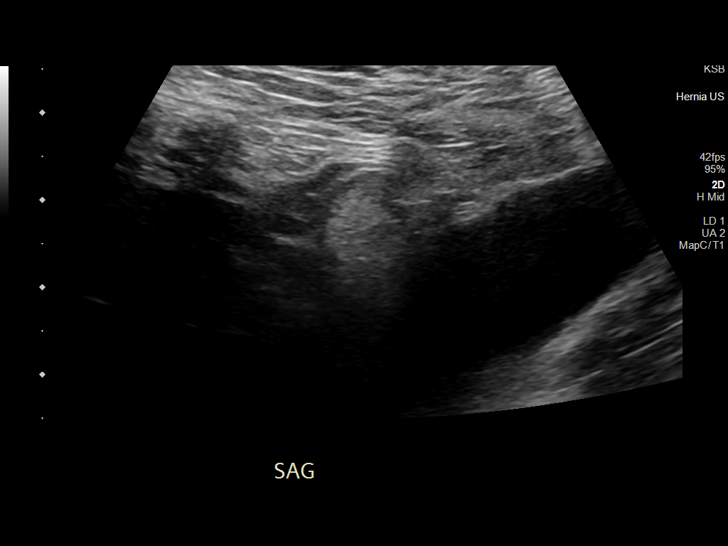
[im 5/14]
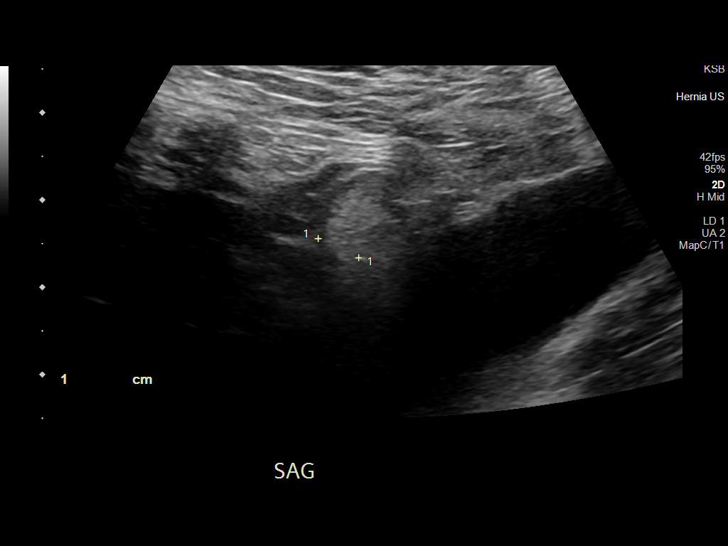
[im 6/14]
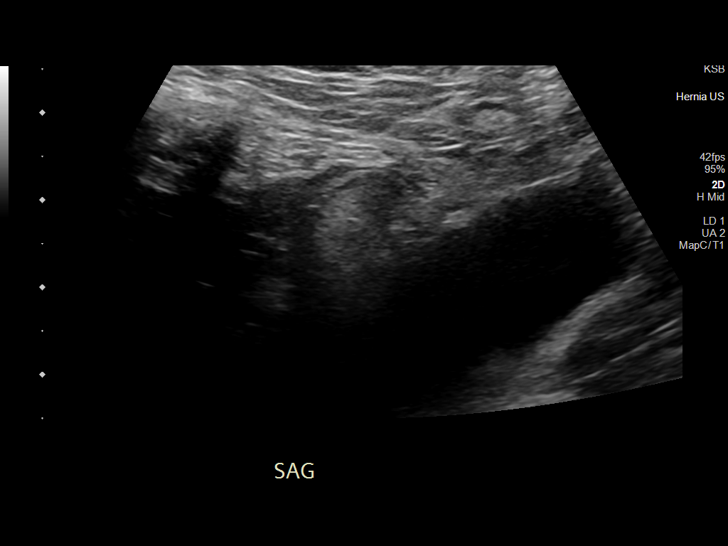
[im 7/14]
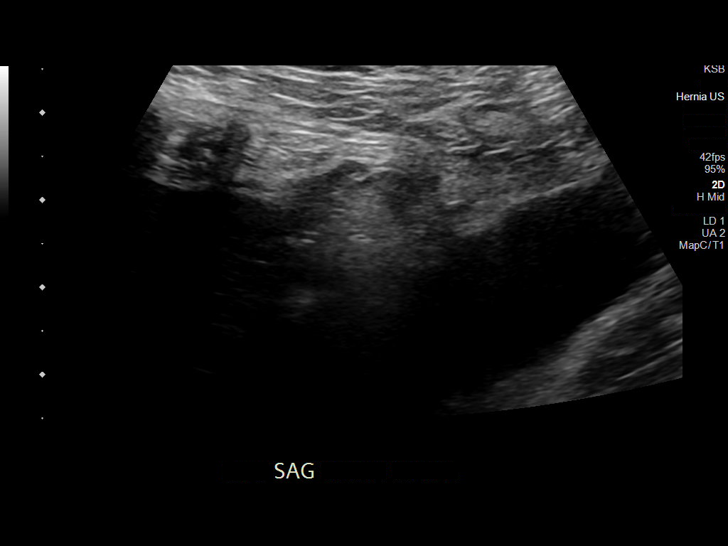
[im 8/14]
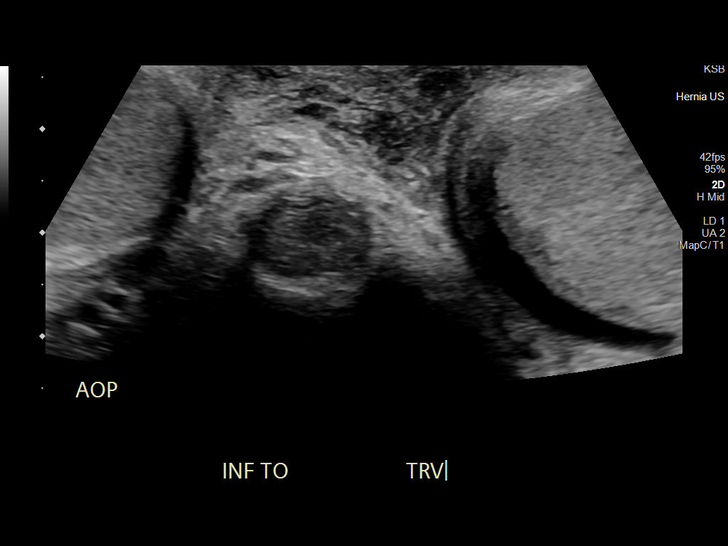
[im 9/14]
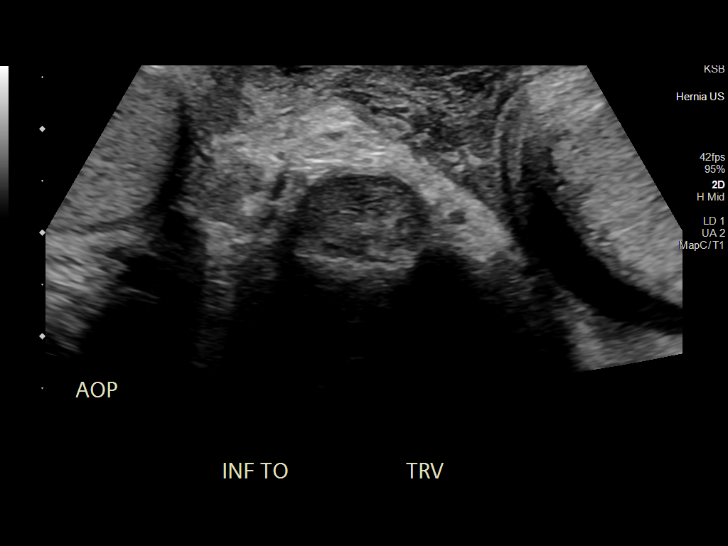
[im 10/14]
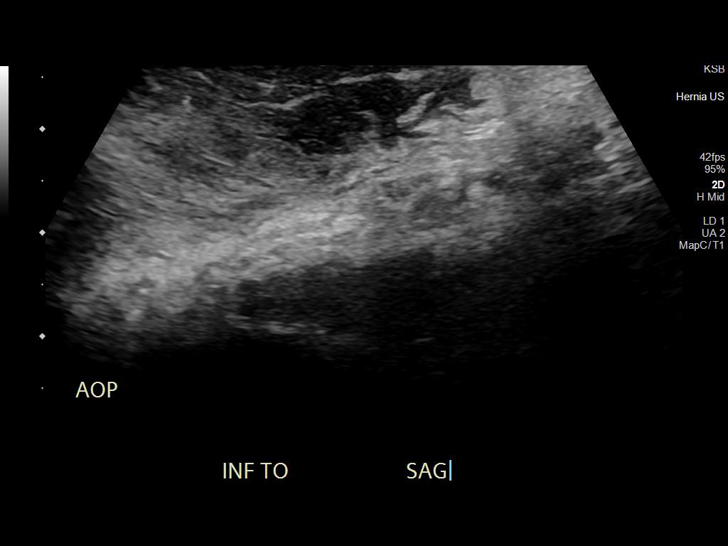
[im 11/14]
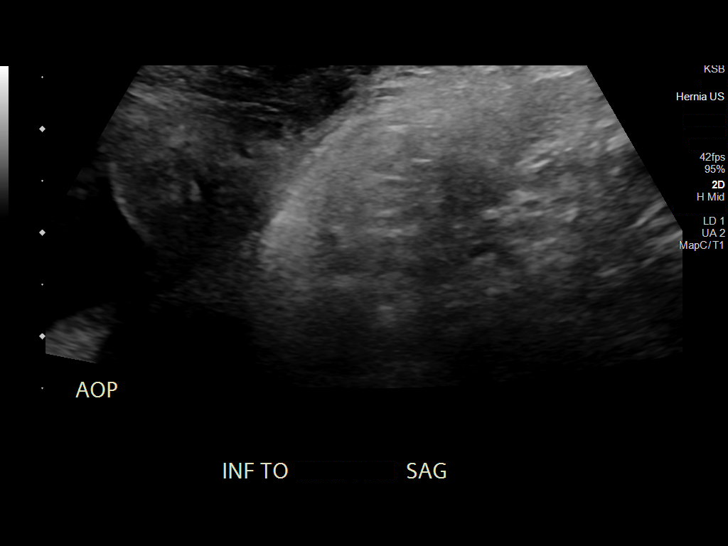
[im 12/14]
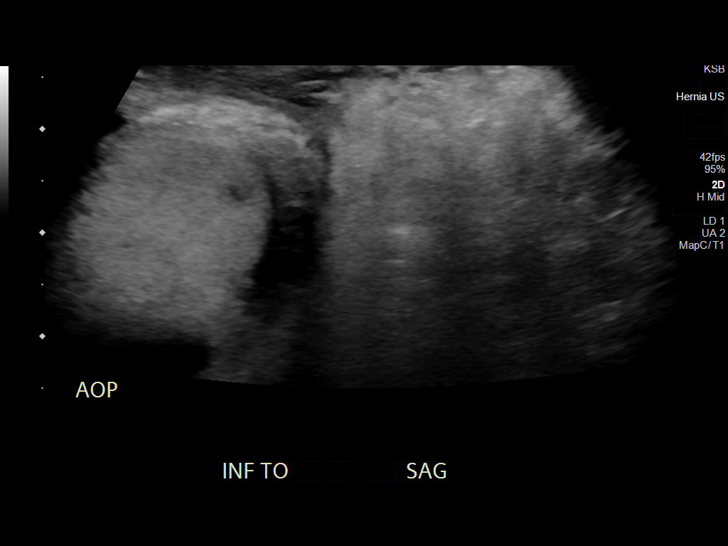
[im 13/14]
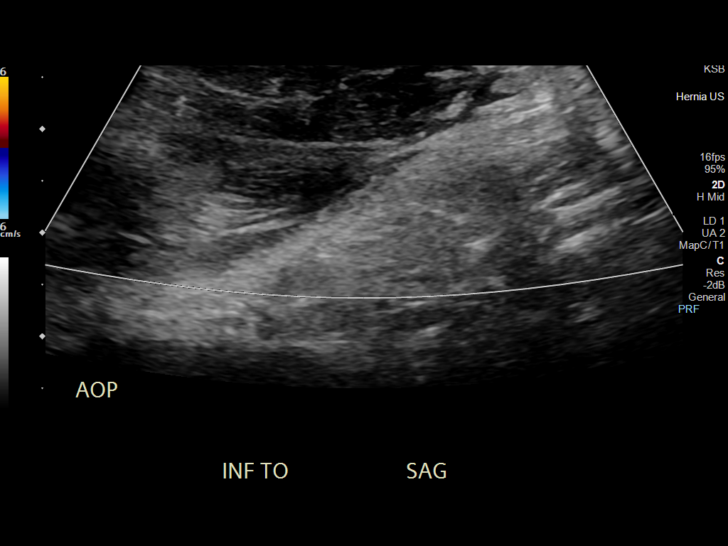
[im 14/14]
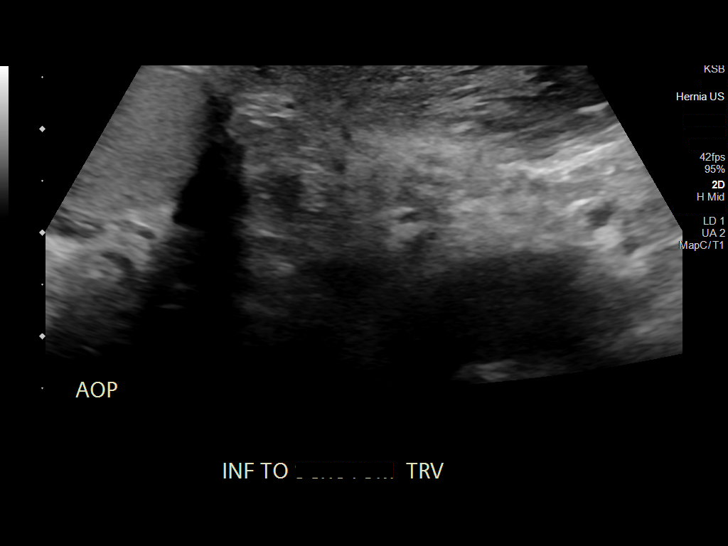

[14 of 14 positions shown; findings below may reference images not displayed]

FINDINGS: No definable mass or fluid collection is seen. There is a reducible
echogenic focus immediately adjacent to what appears to be mesh for
hernia repair which is consistent with fat.
IMPRESSION: Small focus of reducible fat adjacent to the site of what appears to
be mesh for hernia repair. Negative for mass or fluid collection.

## 2022-04-10 DIAGNOSIS — M9903 Segmental and somatic dysfunction of lumbar region: Secondary | ICD-10-CM | POA: Diagnosis not present

## 2022-04-10 DIAGNOSIS — M9902 Segmental and somatic dysfunction of thoracic region: Secondary | ICD-10-CM | POA: Diagnosis not present

## 2022-04-10 DIAGNOSIS — M9904 Segmental and somatic dysfunction of sacral region: Secondary | ICD-10-CM | POA: Diagnosis not present

## 2022-04-10 DIAGNOSIS — M9901 Segmental and somatic dysfunction of cervical region: Secondary | ICD-10-CM | POA: Diagnosis not present

## 2022-04-10 DIAGNOSIS — M531 Cervicobrachial syndrome: Secondary | ICD-10-CM | POA: Diagnosis not present

## 2022-05-20 DIAGNOSIS — N430 Encysted hydrocele: Secondary | ICD-10-CM | POA: Diagnosis not present

## 2022-05-22 ENCOUNTER — Other Ambulatory Visit: Payer: Self-pay | Admitting: Family Medicine

## 2022-06-04 DIAGNOSIS — D485 Neoplasm of uncertain behavior of skin: Secondary | ICD-10-CM | POA: Diagnosis not present

## 2022-06-04 DIAGNOSIS — C44329 Squamous cell carcinoma of skin of other parts of face: Secondary | ICD-10-CM | POA: Diagnosis not present

## 2022-06-21 DIAGNOSIS — M9902 Segmental and somatic dysfunction of thoracic region: Secondary | ICD-10-CM | POA: Diagnosis not present

## 2022-06-21 DIAGNOSIS — M531 Cervicobrachial syndrome: Secondary | ICD-10-CM | POA: Diagnosis not present

## 2022-06-21 DIAGNOSIS — M9904 Segmental and somatic dysfunction of sacral region: Secondary | ICD-10-CM | POA: Diagnosis not present

## 2022-06-21 DIAGNOSIS — M9903 Segmental and somatic dysfunction of lumbar region: Secondary | ICD-10-CM | POA: Diagnosis not present

## 2022-06-21 DIAGNOSIS — M9901 Segmental and somatic dysfunction of cervical region: Secondary | ICD-10-CM | POA: Diagnosis not present

## 2022-06-25 DIAGNOSIS — D225 Melanocytic nevi of trunk: Secondary | ICD-10-CM | POA: Diagnosis not present

## 2022-06-25 DIAGNOSIS — L814 Other melanin hyperpigmentation: Secondary | ICD-10-CM | POA: Diagnosis not present

## 2022-06-25 DIAGNOSIS — D239 Other benign neoplasm of skin, unspecified: Secondary | ICD-10-CM | POA: Diagnosis not present

## 2022-06-25 DIAGNOSIS — L821 Other seborrheic keratosis: Secondary | ICD-10-CM | POA: Diagnosis not present

## 2022-07-18 DIAGNOSIS — C44321 Squamous cell carcinoma of skin of nose: Secondary | ICD-10-CM | POA: Diagnosis not present

## 2022-08-09 DIAGNOSIS — M25512 Pain in left shoulder: Secondary | ICD-10-CM | POA: Diagnosis not present

## 2022-08-12 ENCOUNTER — Encounter: Payer: Self-pay | Admitting: *Deleted

## 2022-08-14 DIAGNOSIS — M25512 Pain in left shoulder: Secondary | ICD-10-CM | POA: Diagnosis not present

## 2022-08-20 DIAGNOSIS — M25512 Pain in left shoulder: Secondary | ICD-10-CM | POA: Diagnosis not present

## 2022-08-22 DIAGNOSIS — M25512 Pain in left shoulder: Secondary | ICD-10-CM | POA: Diagnosis not present

## 2022-08-29 DIAGNOSIS — M25512 Pain in left shoulder: Secondary | ICD-10-CM | POA: Diagnosis not present

## 2022-09-03 DIAGNOSIS — M25512 Pain in left shoulder: Secondary | ICD-10-CM | POA: Diagnosis not present

## 2022-09-23 DIAGNOSIS — C44321 Squamous cell carcinoma of skin of nose: Secondary | ICD-10-CM | POA: Diagnosis not present

## 2022-09-23 DIAGNOSIS — Z5189 Encounter for other specified aftercare: Secondary | ICD-10-CM | POA: Diagnosis not present

## 2022-09-23 DIAGNOSIS — Z8589 Personal history of malignant neoplasm of other organs and systems: Secondary | ICD-10-CM | POA: Diagnosis not present

## 2022-10-31 ENCOUNTER — Encounter: Payer: Self-pay | Admitting: *Deleted

## 2022-11-25 ENCOUNTER — Other Ambulatory Visit: Payer: Self-pay | Admitting: Family Medicine

## 2022-12-17 DIAGNOSIS — M9901 Segmental and somatic dysfunction of cervical region: Secondary | ICD-10-CM | POA: Diagnosis not present

## 2022-12-17 DIAGNOSIS — M531 Cervicobrachial syndrome: Secondary | ICD-10-CM | POA: Diagnosis not present

## 2022-12-17 DIAGNOSIS — M9902 Segmental and somatic dysfunction of thoracic region: Secondary | ICD-10-CM | POA: Diagnosis not present

## 2022-12-17 DIAGNOSIS — M9903 Segmental and somatic dysfunction of lumbar region: Secondary | ICD-10-CM | POA: Diagnosis not present

## 2022-12-17 DIAGNOSIS — M9904 Segmental and somatic dysfunction of sacral region: Secondary | ICD-10-CM | POA: Diagnosis not present

## 2022-12-25 ENCOUNTER — Ambulatory Visit (INDEPENDENT_AMBULATORY_CARE_PROVIDER_SITE_OTHER): Payer: BC Managed Care – PPO | Admitting: Family Medicine

## 2022-12-25 ENCOUNTER — Encounter: Payer: Self-pay | Admitting: Family Medicine

## 2022-12-25 VITALS — BP 151/91 | HR 85 | Temp 97.8°F | Ht 71.0 in | Wt 181.2 lb

## 2022-12-25 DIAGNOSIS — E782 Mixed hyperlipidemia: Secondary | ICD-10-CM

## 2022-12-25 DIAGNOSIS — Z0001 Encounter for general adult medical examination with abnormal findings: Secondary | ICD-10-CM

## 2022-12-25 DIAGNOSIS — Z23 Encounter for immunization: Secondary | ICD-10-CM

## 2022-12-25 DIAGNOSIS — E039 Hypothyroidism, unspecified: Secondary | ICD-10-CM | POA: Diagnosis not present

## 2022-12-25 DIAGNOSIS — R739 Hyperglycemia, unspecified: Secondary | ICD-10-CM

## 2022-12-25 DIAGNOSIS — R002 Palpitations: Secondary | ICD-10-CM

## 2022-12-25 DIAGNOSIS — E349 Endocrine disorder, unspecified: Secondary | ICD-10-CM

## 2022-12-25 DIAGNOSIS — Z1211 Encounter for screening for malignant neoplasm of colon: Secondary | ICD-10-CM

## 2022-12-25 DIAGNOSIS — E559 Vitamin D deficiency, unspecified: Secondary | ICD-10-CM | POA: Diagnosis not present

## 2022-12-25 LAB — COMPREHENSIVE METABOLIC PANEL
ALT: 33 U/L (ref 0–53)
AST: 33 U/L (ref 0–37)
Albumin: 4.7 g/dL (ref 3.5–5.2)
Alkaline Phosphatase: 73 U/L (ref 39–117)
BUN: 12 mg/dL (ref 6–23)
CO2: 29 mEq/L (ref 19–32)
Calcium: 9.5 mg/dL (ref 8.4–10.5)
Chloride: 102 mEq/L (ref 96–112)
Creatinine, Ser: 0.93 mg/dL (ref 0.40–1.50)
GFR: 98.59 mL/min (ref >60.00)
Glucose, Bld: 72 mg/dL (ref 70–99)
Potassium: 4.3 mEq/L (ref 3.5–5.1)
Sodium: 140 mEq/L (ref 135–145)
Total Bilirubin: 0.6 mg/dL (ref 0.2–1.2)
Total Protein: 7.6 g/dL (ref 6.0–8.3)

## 2022-12-25 LAB — LIPID PANEL
Cholesterol: 170 mg/dL (ref 0–200)
HDL: 56.2 mg/dL (ref >39.00)
LDL Cholesterol: 95 mg/dL (ref 0–99)
NonHDL: 113.62
Total CHOL/HDL Ratio: 3
Triglycerides: 93 mg/dL (ref 0.0–149.0)
VLDL: 18.6 mg/dL (ref 0.0–40.0)

## 2022-12-25 LAB — CBC
HCT: 46.6 % (ref 39.0–52.0)
Hemoglobin: 15.7 g/dL (ref 13.0–17.0)
MCHC: 33.8 g/dL (ref 30.0–36.0)
MCV: 93.9 fl (ref 78.0–100.0)
Platelets: 223 10*3/uL (ref 150.0–400.0)
RBC: 4.96 Mil/uL (ref 4.22–5.81)
RDW: 12.8 % (ref 11.5–15.5)
WBC: 5.4 10*3/uL (ref 4.0–10.5)

## 2022-12-25 LAB — VITAMIN D 25 HYDROXY (VIT D DEFICIENCY, FRACTURES): VITD: 57.9 ng/mL (ref 30.00–100.00)

## 2022-12-25 LAB — TESTOSTERONE: Testosterone: 192.78 ng/dL — ABNORMAL LOW (ref 300.00–890.00)

## 2022-12-25 LAB — HEMOGLOBIN A1C: Hgb A1c MFr Bld: 5.1 % (ref 4.6–6.5)

## 2022-12-25 LAB — TSH: TSH: 4.98 u[IU]/mL (ref 0.35–5.50)

## 2022-12-25 NOTE — Progress Notes (Signed)
Chief Complaint:  Ryan Lam is a 47 y.o. male who presents today for his annual comprehensive physical exam.    Assessment/Plan:  New/Acute Problems: Elevated BP Reading Elevated today.  He has previously been well-controlled.  He has not been monitoring at home.  Do not need to start meds today however he will monitor at home over the next several weeks and let us know if persistently elevated.  Chronic Problems Addressed Today: Hypothyroidism Check TSH.  Doing well on Synthroid 100 mcg daily.  Mixed hyperlipidemia Check lipids.  Discussed lifestyle modifications.  Vitamin D deficiency Check vitamin D.  Palpitation He is having more palpitations recently.  We did cardiac workup last year which showed ectopic beats.  Not having any red flag signs or symptoms currently.  We did discuss referral to cardiology however he declined.  We discussed reasons return to care.  Testosterone deficiency Check testosterone.  Preventative Healthcare: Tdap given today.  Check labs.  Order Cologuard.  Patient Counseling(The following topics were reviewed and/or handout was given):  -Nutrition: Stressed importance of moderation in sodium/caffeine intake, saturated fat and cholesterol, caloric balance, sufficient intake of fresh fruits, vegetables, and fiber.  -Stressed the importance of regular exercise.   -Substance Abuse: Discussed cessation/primary prevention of tobacco, alcohol, or other drug use; driving or other dangerous activities under the influence; availability of treatment for abuse.   -Injury prevention: Discussed safety belts, safety helmets, smoke detector, smoking near bedding or upholstery.   -Sexuality: Discussed sexually transmitted diseases, partner selection, use of condoms, avoidance of unintended pregnancy and contraceptive alternatives.   -Dental health: Discussed importance of regular tooth brushing, flossing, and dental visits.  -Health maintenance and  immunizations reviewed. Please refer to Health maintenance section.  Return to care in 1 year for next preventative visit.     Subjective:  HPI:  He has no acute complaints today.   Lifestyle Diet: Balanced.  Plenty fruits and vegetables.    12/25/2022    9:21 AM  Depression screen PHQ 2/9  Decreased Interest 0  Down, Depressed, Hopeless 0  PHQ - 2 Score 0    Health Maintenance Due  Topic Date Due   COVID-19 Vaccine (1) Never done   COLONOSCOPY (Pts 45-53yrs Insurance coverage will need to be confirmed)  Never done   DTaP/Tdap/Td (2 - Td or Tdap) 01/15/2022     ROS: Per HPI, otherwise a complete review of systems was negative.   PMH:  The following were reviewed and entered/updated in epic: Past Medical History:  Diagnosis Date   Chest pain 01/16/2019   to see dr Jerline Pain for 01-18-2019 or 01-19-2019   GERD (gastroesophageal reflux disease)    hx of   History of kidney stones    Hypothyroidism    Left inguinal hernia    Sleep apnea    osa getting dental device   Patient Active Problem List   Diagnosis Date Noted   Palpitation 12/28/2021   Nasal turbinate hypertrophy 11/27/2021   OSA (obstructive sleep apnea) 10/07/2018   Hypothyroidism 09/29/2014   Testosterone deficiency 09/29/2014   Mixed hyperlipidemia 09/29/2014   Vitamin D deficiency 09/29/2014   Kidney stones 10/27/2013   Past Surgical History:  Procedure Laterality Date   chelazon removed from eye Left 2019   INGUINAL HERNIA REPAIR Left 01/20/2019   Procedure: LEFT OPEN Barrett;  Surgeon: Kinsinger, Arta Bruce, MD;  Location: [facility];  Service: General;  Laterality: Left;   LASIK  2008    Family History  Problem Relation Age of Onset   Cancer Mother        hx breast   COPD Father    AAA (abdominal aortic aneurysm) Father    Diabetes Maternal Grandmother    Stroke Maternal Grandfather     Medications- reviewed and updated Current Outpatient Medications   Medication Sig Dispense Refill   Cholecalciferol 50 MCG (2000 UT) CAPS Take 5,000 Units by mouth 2 (two) times daily.      levothyroxine (SYNTHROID) 100 MCG tablet TAKE 1 TABLET BY MOUTH DAILY 90 tablet 0   Zinc 25 MG TABS Take 50 mg by mouth daily.      No current facility-administered medications for this visit.    Allergies-reviewed and updated Allergies  Allergen Reactions   Penicillins     Childhood reaction hives    Social History   Socioeconomic History   Marital status: Married    Spouse name: Not on file   Number of children: Not on file   Years of education: Not on file   Highest education level: Not on file  Occupational History   Not on file  Tobacco Use   Smoking status: Never   Smokeless tobacco: Never  Vaping Use   Vaping Use: Never used  Substance and Sexual Activity   Alcohol use: Yes    Alcohol/week: 10.0 standard drinks of alcohol    Types: 10 Standard drinks or equivalent per week    Comment: 2-3 drinks 5 days week   Drug use: No   Sexual activity: Never  Other Topics Concern   Not on file  Social History Narrative   Not on file   Social Determinants of Health   Financial Resource Strain: Not on file  Food Insecurity: Not on file  Transportation Needs: Not on file  Physical Activity: Not on file  Stress: Not on file  Social Connections: Not on file        Objective:  Physical Exam: BP (!) 151/91   Pulse 85   Temp 97.8 F (36.6 C) (Temporal)   Ht 5\' 11"  (1.803 m)   Wt 181 lb 3.2 oz (82.2 kg)   SpO2 100%   BMI 25.27 kg/m   Body mass index is 25.27 kg/m. Wt Readings from Last 3 Encounters:  12/25/22 181 lb 3.2 oz (82.2 kg)  12/28/21 178 lb 12.8 oz (81.1 kg)  11/22/21 183 lb (83 kg)   Gen: NAD, resting comfortably HEENT: TMs normal bilaterally. OP clear. No thyromegaly noted.  CV: RRR with no murmurs appreciated Pulm: NWOB, CTAB with no crackles, wheezes, or rhonchi GI: Normal bowel sounds present. Soft, Nontender,  Nondistended. MSK: no edema, cyanosis, or clubbing noted Skin: warm, dry Neuro: CN2-12 grossly intact. Strength 5/5 in upper and lower extremities. Reflexes symmetric and intact bilaterally.  Psych: Normal affect and thought content     Lashann Hagg M. Jerline Pain, MD 12/25/2022 10:31 AM

## 2022-12-25 NOTE — Assessment & Plan Note (Signed)
Check testosterone. 

## 2022-12-25 NOTE — Assessment & Plan Note (Signed)
Check lipids. Discussed lifestyle modifications.  

## 2022-12-25 NOTE — Assessment & Plan Note (Signed)
Check TSH.  Doing well on Synthroid 100 mcg daily.

## 2022-12-25 NOTE — Addendum Note (Signed)
Addended by: Betti Cruz on: 12/25/2022 11:39 AM   Modules accepted: Orders

## 2022-12-25 NOTE — Patient Instructions (Signed)
It was very nice to see you today!  We will check blood work today.  Will give your tetanus shot today.  Will set you up for cologuard.  Please keep an eye on your blood pressure at home and let us know if it is persistently 140/90 or higher.  Will see back in year for your next physical.  Come back sooner if needed.  Take care, Dr Jerline Pain  PLEASE NOTE:  If you had any lab tests, please let us know if you have not heard back within a few days. You may see your results on mychart before we have a chance to review them but we will give you a call once they are reviewed by [name].   If we ordered any referrals today, please let us know if you have not heard from their office within the next week.   If you had any urgent prescriptions sent in today, please check with the pharmacy within an hour of our visit to make sure the prescription was transmitted appropriately.   Please try these tips to maintain a healthy lifestyle:  Eat at least 3 REAL meals and 1-2 snacks per day.  Aim for no more than 5 hours between eating.  If you eat breakfast, please do so within one hour of getting up.   Each meal should contain half fruits/vegetables, one quarter protein, and one quarter carbs (no bigger than a computer mouse)  Cut down on sweet beverages. This includes juice, soda, and sweet tea.   Drink at least 1 glass of water with each meal and aim for at least 8 glasses per day  Exercise at least 150 minutes every week.    Preventive Care 15-7 Years Old, Male Preventive care refers to lifestyle choices and visits with your health care provider that can promote health and wellness. Preventive care visits are also called wellness exams. What can I expect for my preventive care visit? Counseling During your preventive care visit, your health care provider may ask about your: Medical history, including: Past medical problems. Family medical history. Current health, including: Emotional  well-being. Home life and relationship well-being. Sexual activity. Lifestyle, including: Alcohol, nicotine or tobacco, and drug use. Access to firearms. Diet, exercise, and sleep habits. Safety issues such as seatbelt and bike helmet use. Sunscreen use. Work and work Statistician. Physical exam Your health care provider will check your: Height and weight. These may be used to calculate your BMI (body mass index). BMI is a measurement that tells if you are at a healthy weight. Waist circumference. This measures the distance around your waistline. This measurement also tells if you are at a healthy weight and may help predict your risk of certain diseases, such as type 2 diabetes and high blood pressure. Heart rate and blood pressure. Body temperature. Skin for abnormal spots. What immunizations do I need?  Vaccines are usually given at various ages, according to a schedule. Your health care provider will recommend vaccines for you based on your age, medical history, and lifestyle or other factors, such as travel or where you work. What tests do I need? Screening Your health care provider may recommend screening tests for certain conditions. This may include: Lipid and cholesterol levels. Diabetes screening. This is done by checking your blood sugar (glucose) after you have not eaten for a while (fasting). Hepatitis B test. Hepatitis C test. HIV (human immunodeficiency virus) test. STI (sexually transmitted infection) testing, if you are at risk. Lung cancer screening. Prostate cancer  screening. Colorectal cancer screening. Talk with your health care provider about your test results, treatment options, and if necessary, the need for more tests. Follow these instructions at home: Eating and drinking  Eat a diet that includes fresh fruits and vegetables, whole grains, lean protein, and low-fat dairy products. Take vitamin and mineral supplements as recommended by your health care  provider. Do not drink alcohol if your health care provider tells you not to drink. If you drink alcohol: Limit how much you have to 0-2 drinks a day. Know how much alcohol is in your drink. In the [place] one drink equals one 12 oz bottle of beer (355 mL), one 5 oz glass of wine (148 mL), or one 1 oz glass of hard liquor (44 mL). Lifestyle Brush your teeth every morning and night with fluoride toothpaste. Floss one time each day. Exercise for at least 30 minutes 5 or more days each week. Do not use any products that contain nicotine or tobacco. These products include cigarettes, chewing tobacco, and vaping devices, such as e-cigarettes. If you need help quitting, ask your health care provider. Do not use drugs. If you are sexually active, practice safe sex. Use a condom or other form of protection to prevent STIs. Take aspirin only as told by your health care provider. Make sure that you understand how much to take and what form to take. Work with your health care provider to find out whether it is safe and beneficial for you to take aspirin daily. Find healthy ways to manage stress, such as: Meditation, yoga, or listening to music. Journaling. Talking to a trusted person. Spending time with friends and family. Minimize exposure to UV radiation to reduce your risk of skin cancer. Safety Always wear your seat belt while driving or riding in a vehicle. Do not drive: If you have been drinking alcohol. Do not ride with someone who has been drinking. When you are tired or distracted. While texting. If you have been using any mind-altering substances or drugs. Wear a helmet and other protective equipment during sports activities. If you have firearms in your house, make sure you follow all gun safety procedures. What's next? Go to your health care provider once a year for an annual wellness visit. Ask your health care provider how often you should have your eyes and teeth checked. Stay up to  date on all vaccines. This information is not intended to replace advice given to you by your health care provider. Make sure you discuss any questions you have with your health care provider. Document Revised: 05/02/2021 Document Reviewed: 05/02/2021 Elsevier Patient Education  [place].

## 2022-12-25 NOTE — Assessment & Plan Note (Signed)
Check vitamin D. 

## 2022-12-25 NOTE — Assessment & Plan Note (Signed)
He is having more palpitations recently.  We did cardiac workup last year which showed ectopic beats.  Not having any red flag signs or symptoms currently.  We did discuss referral to cardiology however he declined.  We discussed reasons return to care.

## 2022-12-27 NOTE — Progress Notes (Signed)
Please inform patient of the following:  Testosterone is low but stable to last several values.  If he is interested in replacement we can refer him to endocrinology or urology.  All of his other labs are stable.  He should keep up the great work diet and exercise.  We can recheck in a year.

## 2023-01-03 NOTE — Telephone Encounter (Signed)
I dont have much experience with testosterone booster supplements but he can try if he wishes.  [name]. Jerline Pain, MD 01/03/2023 3:41 PM

## 2023-01-12 DIAGNOSIS — Z1211 Encounter for screening for malignant neoplasm of colon: Secondary | ICD-10-CM | POA: Diagnosis not present

## 2023-01-17 IMAGING — DX DG CHEST 2V
2 series · 2 of 2 positions shown · non-contrast
Comparison: None.

CLINICAL DATA: Chest pain

EXAM:
CHEST - 2 VIEW

[chest pa]
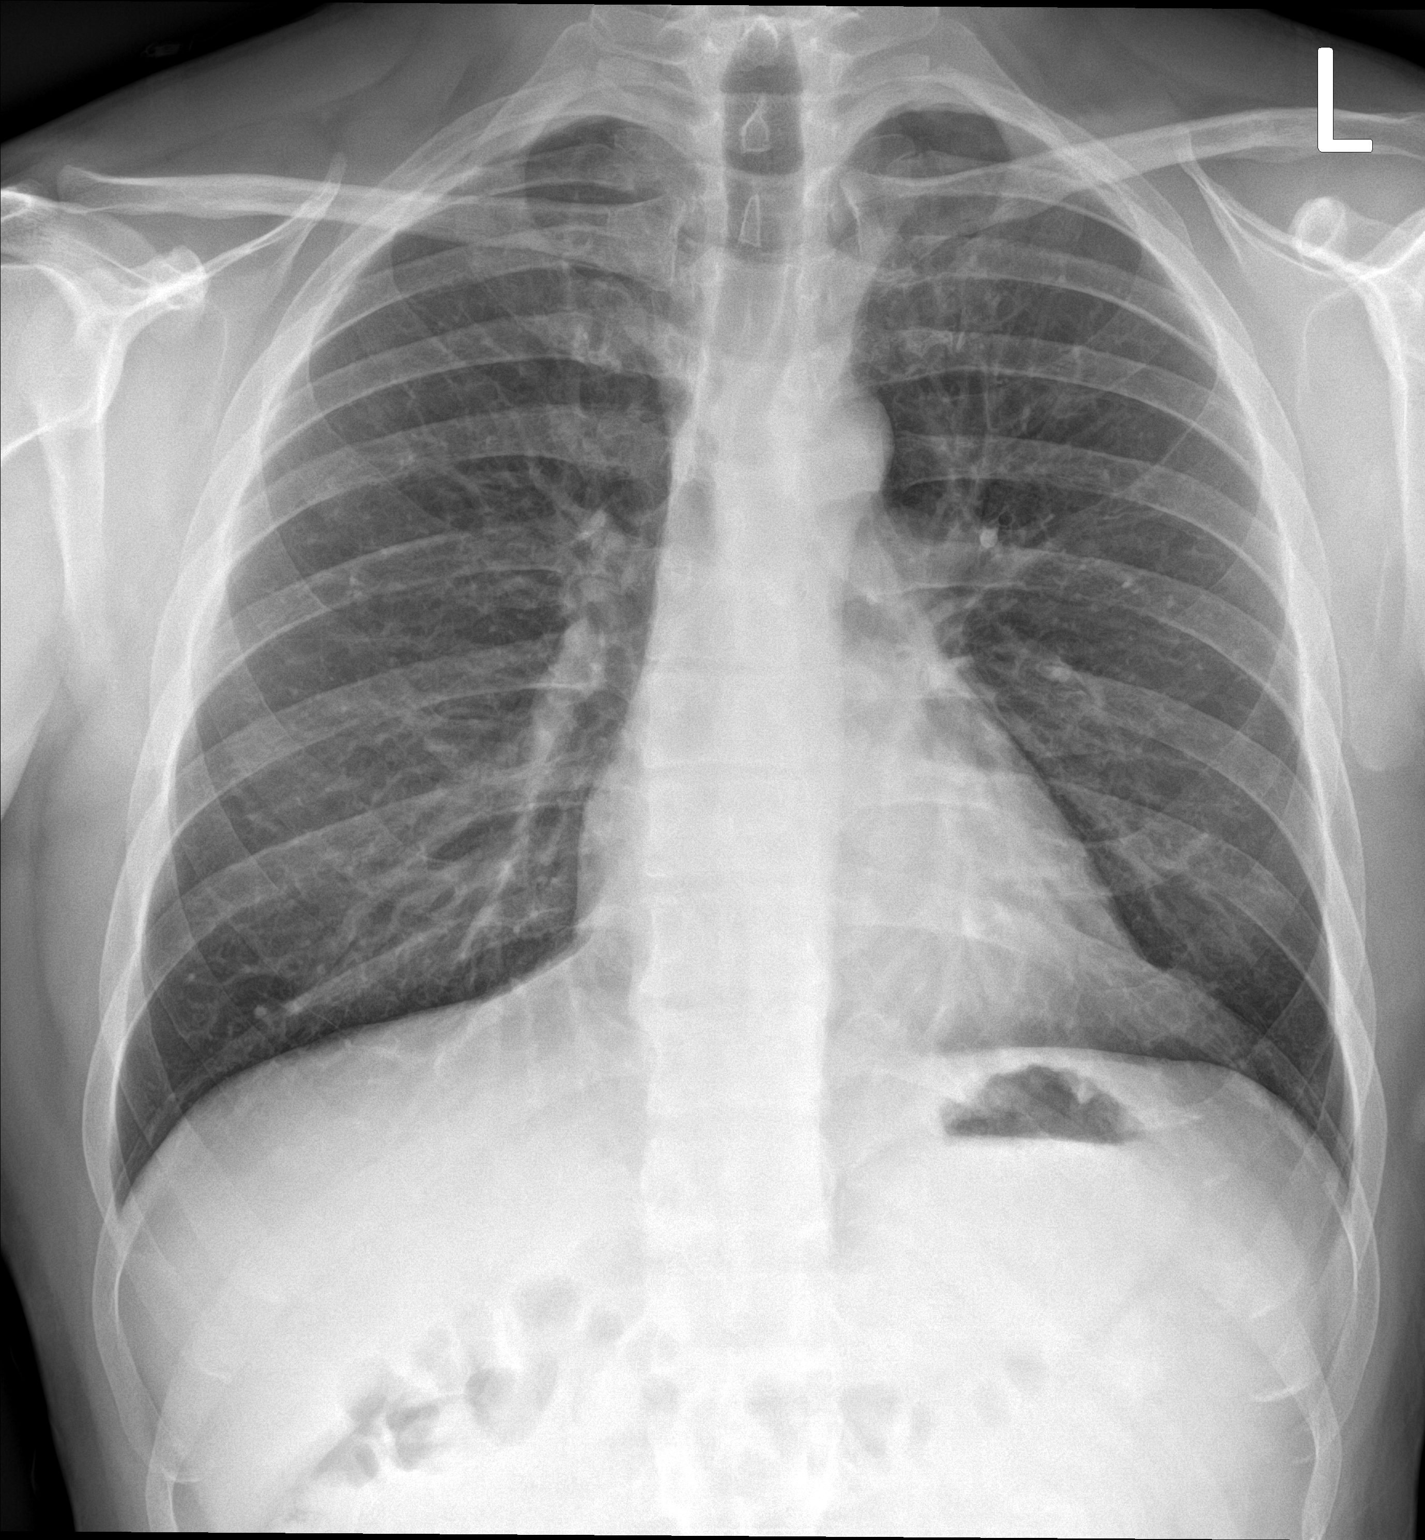

[chest lat]
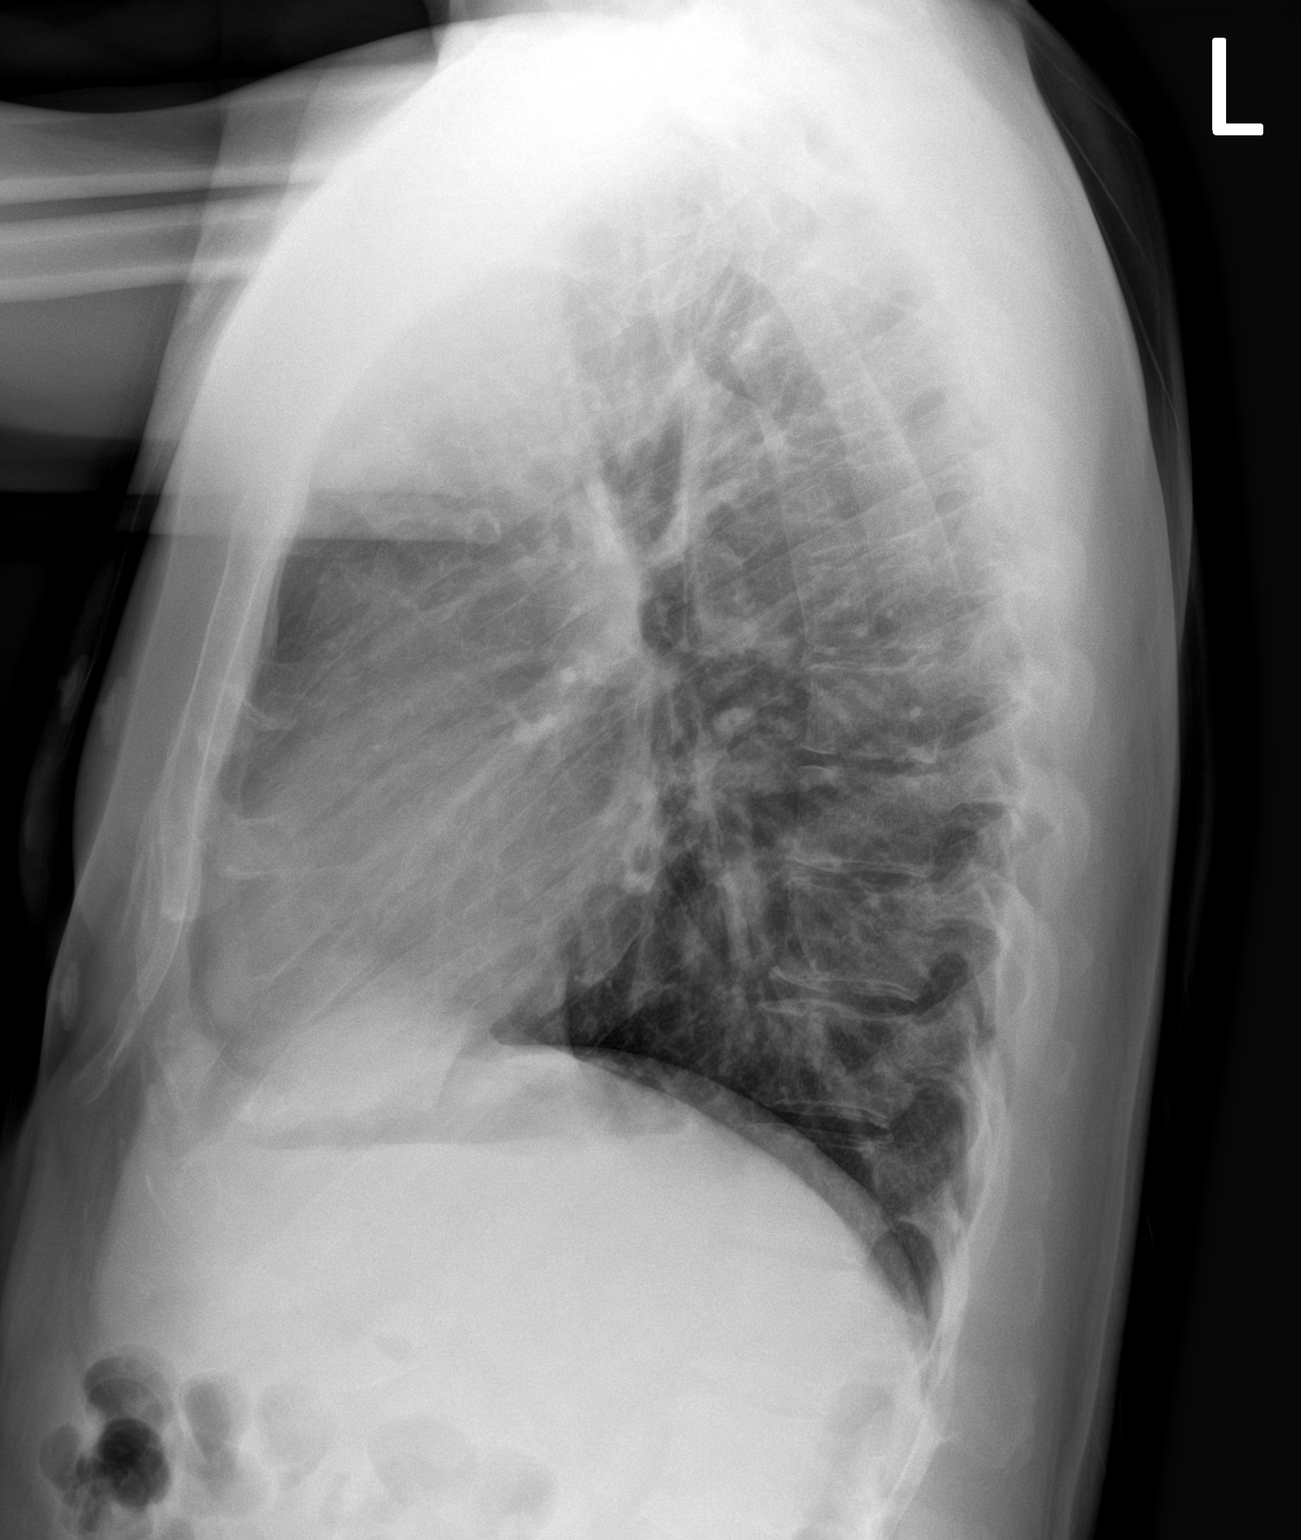

[2 of 2 positions shown; findings below may reference images not displayed]

FINDINGS: The cardiomediastinal silhouette is normal.

There is no focal consolidation or pulmonary edema. There is no
pleural effusion or pneumothorax.

There is no acute osseous abnormality.
IMPRESSION: No radiographic evidence of acute cardiopulmonary process.

## 2023-01-23 LAB — COLOGUARD: COLOGUARD: NEGATIVE

## 2023-01-23 NOTE — Progress Notes (Signed)
Please inform patient of the following:  Great news! Cologuard is negative.  We can repeat in 3 years.  Ryan Lam. Jerline Pain, MD 01/23/2023 12:42 PM

## 2023-02-23 ENCOUNTER — Other Ambulatory Visit: Payer: Self-pay | Admitting: Family Medicine

## 2023-03-05 ENCOUNTER — Encounter: Payer: Self-pay | Admitting: Family Medicine

## 2023-03-05 NOTE — Telephone Encounter (Signed)
Not sure if it will help but should not be an issue to try.  Ryan Lam. [name], MD 03/05/2023 10:43 AM

## 2023-05-02 DIAGNOSIS — M9901 Segmental and somatic dysfunction of cervical region: Secondary | ICD-10-CM | POA: Diagnosis not present

## 2023-05-02 DIAGNOSIS — M531 Cervicobrachial syndrome: Secondary | ICD-10-CM | POA: Diagnosis not present

## 2023-05-02 DIAGNOSIS — M9902 Segmental and somatic dysfunction of thoracic region: Secondary | ICD-10-CM | POA: Diagnosis not present

## 2023-05-02 DIAGNOSIS — M9904 Segmental and somatic dysfunction of sacral region: Secondary | ICD-10-CM | POA: Diagnosis not present

## 2023-05-02 DIAGNOSIS — M9903 Segmental and somatic dysfunction of lumbar region: Secondary | ICD-10-CM | POA: Diagnosis not present

## 2023-05-24 ENCOUNTER — Other Ambulatory Visit: Payer: Self-pay | Admitting: Family Medicine

## 2023-07-08 DIAGNOSIS — D2339 Other benign neoplasm of skin of other parts of face: Secondary | ICD-10-CM | POA: Diagnosis not present

## 2023-07-08 DIAGNOSIS — L814 Other melanin hyperpigmentation: Secondary | ICD-10-CM | POA: Diagnosis not present

## 2023-07-08 DIAGNOSIS — D485 Neoplasm of uncertain behavior of skin: Secondary | ICD-10-CM | POA: Diagnosis not present

## 2023-07-08 DIAGNOSIS — Z808 Family history of malignant neoplasm of other organs or systems: Secondary | ICD-10-CM | POA: Diagnosis not present

## 2023-07-08 DIAGNOSIS — L821 Other seborrheic keratosis: Secondary | ICD-10-CM | POA: Diagnosis not present

## 2023-07-08 DIAGNOSIS — D225 Melanocytic nevi of trunk: Secondary | ICD-10-CM | POA: Diagnosis not present

## 2023-07-18 ENCOUNTER — Encounter: Payer: Self-pay | Admitting: Family Medicine

## 2023-07-18 ENCOUNTER — Ambulatory Visit: Payer: BC Managed Care – PPO | Admitting: Family Medicine

## 2023-07-18 VITALS — BP 138/88 | HR 74 | Temp 97.3°F | Ht 71.0 in | Wt 182.4 lb

## 2023-07-18 DIAGNOSIS — G4733 Obstructive sleep apnea (adult) (pediatric): Secondary | ICD-10-CM

## 2023-07-18 DIAGNOSIS — R519 Headache, unspecified: Secondary | ICD-10-CM

## 2023-07-18 DIAGNOSIS — I10 Essential (primary) hypertension: Secondary | ICD-10-CM | POA: Diagnosis not present

## 2023-07-18 LAB — COMPREHENSIVE METABOLIC PANEL
ALT: 25 U/L (ref 0–53)
AST: 26 U/L (ref 0–37)
Albumin: 4.1 g/dL (ref 3.5–5.2)
Alkaline Phosphatase: 74 U/L (ref 39–117)
BUN: 14 mg/dL (ref 6–23)
CO2: 30 mEq/L (ref 19–32)
Calcium: 9.6 mg/dL (ref 8.4–10.5)
Chloride: 103 mEq/L (ref 96–112)
Creatinine, Ser: 1.04 mg/dL (ref 0.40–1.50)
GFR: 85.87 mL/min (ref >60.00)
Glucose, Bld: 69 mg/dL — ABNORMAL LOW (ref 70–99)
Potassium: 4.1 mEq/L (ref 3.5–5.1)
Sodium: 139 mEq/L (ref 135–145)
Total Bilirubin: 0.5 mg/dL (ref 0.2–1.2)
Total Protein: 7.2 g/dL (ref 6.0–8.3)

## 2023-07-18 LAB — TSH: TSH: 2.49 u[IU]/mL (ref 0.35–5.50)

## 2023-07-18 LAB — CBC
HCT: 45.9 % (ref 39.0–52.0)
Hemoglobin: 14.9 g/dL (ref 13.0–17.0)
MCHC: 32.4 g/dL (ref 30.0–36.0)
MCV: 93.6 fl (ref 78.0–100.0)
Platelets: 243 10*3/uL (ref 150.0–400.0)
RBC: 4.9 Mil/uL (ref 4.22–5.81)
RDW: 13.3 % (ref 11.5–15.5)
WBC: 7.5 10*3/uL (ref 4.0–10.5)

## 2023-07-18 LAB — HEMOGLOBIN A1C: Hgb A1c MFr Bld: 5.3 % (ref 4.6–6.5)

## 2023-07-18 MED ORDER — AMLODIPINE BESYLATE 5 MG PO TABS: 5.0000 mg | ORAL_TABLET | Freq: Every day | ORAL | 3 refills | Status: DC

## 2023-07-18 NOTE — Progress Notes (Signed)
Ryan Lam is a 47 y.o. male who presents today for an office visit.  Assessment/Plan:  New/Acute Problems: Dizziness  Reassured no neurologic exam today and no current symptoms.  Sounds like he is having vertigo-like symptoms.  Given his reassuring exam today do not think that we need to obtain imaging at this point.  Will check labs.  He also has elevated blood pressure readings at his last several office visits and this is likely contributing as well.  Will be treating his blood pressure as below.  Differential also includes migraines though is never been diagnosed with this in the past.  If symptoms persist despite treatment for blood pressure as below but his labs are negative would consider imaging versus referral to neurology.  We discussed reasons to return to care and seek emergent care.  Chronic Problems Addressed Today: Essential hypertension Initially 156/104 at today's office visit.  Has had multiple readings above goal at her last several visits and he is not checking routinely at home.  It is possible that his episodic headache and dizziness may be related to blood pressure spikes.  We discussed treatment options.  We will start amlodipine 5 mg daily.  We discussed potential side effects.  He will monitor his blood pressure at home for the next 1 to 2 weeks and follow-up with [name] at that time.  We discussed reasons to return to care.  Headache History is consistent with migraine like headaches however he has never been diagnosed with this in the past.  It is possible that this could be contributing some to his dizziness and brain fog as above though we will be treating for his hypertension as above the first.  We are also checking labs today.  If headaches persist despite adequate treatment of hypertension will need imaging and/or referral to neurology.  OSA (obstructive sleep apnea) He does not wish to be on CPAP.  He may be interested in dental appliance and will follow-up  with dentistry regarding this.  Discussed that treatment for his OSA would likely improve his blood pressure readings.     Subjective:  HPI:  See Assessment / plan for status of chronic conditions.  Patient here today with episodic dizziness.  First episode was about 3 weeks ago.  Was in his usual state of health at home doing a meeting.  States that he turned his head to look which caused him to feel off balance and dizzy like things were moving back and forth.  He had to stop his meeting and go lay down.  He took a Dramamine for this which helped.  Symptoms improved however for the next several days to week he felt "off" and "sluggish."  He has been getting pressure sensation behind his eyes and in the sinus area.  He was concerned that it may have been due to a an eye issue and saw his optometrist for this recently.  Apparently his optometry exam was normal however they were concerned about potential sinus infection.  He was given a prescription for Z-Pak at that point however he has not yet started this.  He still does get intermittent facial pressure.  Symptoms had improved however he did have another recurrence about a week ago or so.  Denies any loss of consciousness or syncope.  No weakness or numbness.  Describes the dizziness as being off balance like things are moving around him.  He is still does get occasional headaches behind his left eye.  Objective:  Physical Exam: BP 138/88   Pulse 74   Temp (!) 97.3 F (36.3 C) (Temporal)   Ht 5\' 11"  (1.803 m)   Wt 182 lb 6.4 oz (82.7 kg)   SpO2 98%   BMI 25.44 kg/m   Gen: No acute distress, resting comfortably HEENT: TMs clear. CV: Regular rate and rhythm with no murmurs appreciated Pulm: Normal work of breathing, clear to auscultation bilaterally with no crackles, wheezes, or rhonchi Neuro: Cranial nerves II through XII intact.  Finger-nose/finger testing intact bilaterally.  Strength 5 out of 5 in upper and lower extremities.   Sensation and light touch intact throughout.  Reflexes 2+ and symmetric bilaterally. Psych: Normal affect and thought content      Graeme Menees M. [name], MD 07/18/2023 11:22 AM

## 2023-07-18 NOTE — Assessment & Plan Note (Signed)
History is consistent with migraine like headaches however he has never been diagnosed with this in the past.  It is possible that this could be contributing some to his dizziness and brain fog as above though we will be treating for his hypertension as above the first.  We are also checking labs today.  If headaches persist despite adequate treatment of hypertension will need imaging and/or referral to neurology.

## 2023-07-18 NOTE — Assessment & Plan Note (Signed)
He does not wish to be on CPAP.  He may be interested in dental appliance and will follow-up with dentistry regarding this.  Discussed that treatment for his OSA would likely improve his blood pressure readings.

## 2023-07-18 NOTE — Patient Instructions (Signed)
It was very nice to see you today!  I think your symptoms are probably coming from your blood pressure.  Please start the amlodipine.  Please follow-up with me in a couple of weeks to let me know how this is working for you and how your blood pressures are looking.  We will check blood work today.  Return in about 2 weeks (around 08/01/2023).   Take care, Dr [name]  PLEASE NOTE:  If you had any lab tests, please let us know if you have not heard back within a few days. You may see your results on mychart before we have a chance to review them but we will give you a call once they are reviewed by [name].   If we ordered any referrals today, please let us know if you have not heard from their office within the next week.   If you had any urgent prescriptions sent in today, please check with the pharmacy within an hour of our visit to make sure the prescription was transmitted appropriately.   Please try these tips to maintain a healthy lifestyle:  Eat at least 3 REAL meals and 1-2 snacks per day.  Aim for no more than 5 hours between eating.  If you eat breakfast, please do so within one hour of getting up.   Each meal should contain half fruits/vegetables, one quarter protein, and one quarter carbs (no bigger than a computer mouse)  Cut down on sweet beverages. This includes juice, soda, and sweet tea.   Drink at least 1 glass of water with each meal and aim for at least 8 glasses per day  Exercise at least 150 minutes every week.

## 2023-07-18 NOTE — Assessment & Plan Note (Signed)
Initially 156/104 at today's office visit.  Has had multiple readings above goal at her last several visits and he is not checking routinely at home.  It is possible that his episodic headache and dizziness may be related to blood pressure spikes.  We discussed treatment options.  We will start amlodipine 5 mg daily.  We discussed potential side effects.  He will monitor his blood pressure at home for the next 1 to 2 weeks and follow-up with [name] at that time.  We discussed reasons to return to care.

## 2023-07-23 NOTE — Progress Notes (Signed)
Blood work is all normal.  Would like for him to let us know if his dizziness and headache is not improving with treatment for his blood pressure.

## 2023-11-07 ENCOUNTER — Encounter: Payer: Self-pay | Admitting: Family Medicine

## 2023-11-07 NOTE — Telephone Encounter (Signed)
No concerns with starting this.  Ryan Lam. [name], MD 11/07/2023 12:45 PM

## 2023-11-07 NOTE — Telephone Encounter (Signed)
Please advise 

## 2023-11-13 ENCOUNTER — Other Ambulatory Visit: Payer: Self-pay | Admitting: Family Medicine

## 2024-02-24 ENCOUNTER — Ambulatory Visit: Admitting: Family Medicine

## 2024-02-24 VITALS — BP 134/80 | HR 90 | Temp 98.1°F | Ht 71.0 in | Wt 182.6 lb

## 2024-02-24 DIAGNOSIS — E039 Hypothyroidism, unspecified: Secondary | ICD-10-CM | POA: Diagnosis not present

## 2024-02-24 DIAGNOSIS — R21 Rash and other nonspecific skin eruption: Secondary | ICD-10-CM

## 2024-02-24 DIAGNOSIS — I10 Essential (primary) hypertension: Secondary | ICD-10-CM

## 2024-02-24 MED ORDER — METHYLPREDNISOLONE ACETATE 80 MG/ML IJ SUSP
80.0000 mg | Freq: Once | INTRAMUSCULAR | Status: AC
  Administered 2024-02-24: 80 mg via INTRAMUSCULAR

## 2024-02-24 MED ORDER — PREDNISONE 10 MG PO TABS: 10.0000 mg | ORAL_TABLET | Freq: Every day | ORAL | 0 refills | Status: DC

## 2024-02-24 NOTE — Assessment & Plan Note (Signed)
 At goal on amlodipine 5 mg daily.  Doing well with this.

## 2024-02-24 NOTE — Patient Instructions (Signed)
 It was very nice to see you today!  We will give you a cortisone shot today.  Please start the prednisone.  Let us know if your rash does not improve  Return if symptoms worsen or fail to improve.   Take care, Dr [name]  PLEASE NOTE:  If you had any lab tests, please let us know if you have not heard back within a few days. You may see your results on mychart before we have a chance to review them but we will give you a call once they are reviewed by [name].   If we ordered any referrals today, please let us know if you have not heard from their office within the next week.   If you had any urgent prescriptions sent in today, please check with the pharmacy within an hour of our visit to make sure the prescription was transmitted appropriately.   Please try these tips to maintain a healthy lifestyle:  Eat at least 3 REAL meals and 1-2 snacks per day.  Aim for no more than 5 hours between eating.  If you eat breakfast, please do so within one hour of getting up.   Each meal should contain half fruits/vegetables, one quarter protein, and one quarter carbs (no bigger than a computer mouse)  Cut down on sweet beverages. This includes juice, soda, and sweet tea.   Drink at least 1 glass of water with each meal and aim for at least 8 glasses per day  Exercise at least 150 minutes every week.

## 2024-02-24 NOTE — Assessment & Plan Note (Signed)
 Stable on Synthroid 100 mcg daily.  Does not need refill today.

## 2024-02-24 NOTE — Progress Notes (Signed)
   Ryan Lam is a 48 y.o. male who presents today for an office visit.  Assessment/Plan:  New/Acute Problems: Rash  Consistent with contact dermatitis.  Not much improvement with over-the-counter meds.  Given widespread distribution would be reasonable to start systemic steroids at this point.  Will give 80 mg Depo-Medrol.  Start prednisone 10 mg daily for the next 2 weeks.  He can continue hydrocortisone cream to the area as well.  He will let us know if not improving.  We discussed reasons to return to care.  Chronic Problems Addressed Today: Essential hypertension At goal on amlodipine 5 mg daily.  Doing well with this.  Hypothyroidism Stable on Synthroid 100 mcg daily.  Does not need refill today.     Subjective:  HPI:  Patient here with rash. This started about a week ago after doing yard work. Thinks he was exposed to poison ivy.  He has had reactions to this in the past but does not remember the last time this happened.  He tried over-the-counter creams without much improvement.  Initially had some rash on his left arm but has developed more rash on the right arm and lower extremities as well.  No fevers or chills.       Objective:  Physical Exam: BP 134/80   Pulse 90   Temp 98.1 F (36.7 C) (Temporal)   Ht 5\' 11"  (1.803 m)   Wt 182 lb 9.6 oz (82.8 kg)   SpO2 98%   BMI 25.47 kg/m   Gen: No acute distress, resting comfortably Skin: Erythematous rash on arms and legs with scattered vesicles. Neuro: Grossly normal, moves all extremities Psych: Normal affect and thought content      Florette Thai M. [name], MD 02/24/2024 9:09 AM

## 2024-03-03 ENCOUNTER — Encounter: Payer: Self-pay | Admitting: Family Medicine

## 2024-05-15 ENCOUNTER — Other Ambulatory Visit: Payer: Self-pay | Admitting: Family Medicine

## 2024-07-06 DIAGNOSIS — L821 Other seborrheic keratosis: Secondary | ICD-10-CM | POA: Diagnosis not present

## 2024-07-06 DIAGNOSIS — Z808 Family history of malignant neoplasm of other organs or systems: Secondary | ICD-10-CM | POA: Diagnosis not present

## 2024-07-06 DIAGNOSIS — D2339 Other benign neoplasm of skin of other parts of face: Secondary | ICD-10-CM | POA: Diagnosis not present

## 2024-07-06 DIAGNOSIS — L814 Other melanin hyperpigmentation: Secondary | ICD-10-CM | POA: Diagnosis not present

## 2024-07-11 ENCOUNTER — Other Ambulatory Visit: Payer: Self-pay | Admitting: Family Medicine

## 2024-09-07 ENCOUNTER — Ambulatory Visit: Admitting: Family Medicine

## 2024-09-07 VITALS — BP 138/88 | HR 78 | Temp 97.9°F | Resp 16 | Ht 71.0 in | Wt 187.4 lb

## 2024-09-07 DIAGNOSIS — N433 Hydrocele, unspecified: Secondary | ICD-10-CM | POA: Insufficient documentation

## 2024-09-07 DIAGNOSIS — E559 Vitamin D deficiency, unspecified: Secondary | ICD-10-CM | POA: Diagnosis not present

## 2024-09-07 DIAGNOSIS — E782 Mixed hyperlipidemia: Secondary | ICD-10-CM

## 2024-09-07 DIAGNOSIS — I10 Essential (primary) hypertension: Secondary | ICD-10-CM

## 2024-09-07 DIAGNOSIS — M722 Plantar fascial fibromatosis: Secondary | ICD-10-CM | POA: Insufficient documentation

## 2024-09-07 DIAGNOSIS — E039 Hypothyroidism, unspecified: Secondary | ICD-10-CM | POA: Diagnosis not present

## 2024-09-07 DIAGNOSIS — Z131 Encounter for screening for diabetes mellitus: Secondary | ICD-10-CM | POA: Diagnosis not present

## 2024-09-07 DIAGNOSIS — E349 Endocrine disorder, unspecified: Secondary | ICD-10-CM | POA: Diagnosis not present

## 2024-09-07 DIAGNOSIS — M79642 Pain in left hand: Secondary | ICD-10-CM

## 2024-09-07 LAB — CBC
HCT: 44.5 % (ref 39.0–52.0)
Hemoglobin: 14.7 g/dL (ref 13.0–17.0)
MCHC: 33 g/dL (ref 30.0–36.0)
MCV: 92.1 fl (ref 78.0–100.0)
Platelets: 218 K/uL (ref 150.0–400.0)
RBC: 4.83 Mil/uL (ref 4.22–5.81)
RDW: 13.6 % (ref 11.5–15.5)
WBC: 5.8 K/uL (ref 4.0–10.5)

## 2024-09-07 LAB — LIPID PANEL
Cholesterol: 174 mg/dL (ref 0–200)
HDL: 45.5 mg/dL (ref >39.00)
LDL Cholesterol: 93 mg/dL (ref 0–99)
NonHDL: 128.52
Total CHOL/HDL Ratio: 4
Triglycerides: 180 mg/dL — ABNORMAL HIGH (ref 0.0–149.0)
VLDL: 36 mg/dL (ref 0.0–40.0)

## 2024-09-07 LAB — VITAMIN B12: Vitamin B-12: 471 pg/mL (ref 211–911)

## 2024-09-07 LAB — TESTOSTERONE: Testosterone: 176.99 ng/dL — ABNORMAL LOW (ref 300.00–890.00)

## 2024-09-07 LAB — COMPREHENSIVE METABOLIC PANEL WITH GFR
ALT: 24 U/L (ref 0–53)
AST: 24 U/L (ref 0–37)
Albumin: 4.2 g/dL (ref 3.5–5.2)
Alkaline Phosphatase: 73 U/L (ref 39–117)
BUN: 14 mg/dL (ref 6–23)
CO2: 29 meq/L (ref 19–32)
Calcium: 9.2 mg/dL (ref 8.4–10.5)
Chloride: 104 meq/L (ref 96–112)
Creatinine, Ser: 1.1 mg/dL (ref 0.40–1.50)
GFR: 79.64 mL/min (ref >60.00)
Glucose, Bld: 101 mg/dL — ABNORMAL HIGH (ref 70–99)
Potassium: 3.9 meq/L (ref 3.5–5.1)
Sodium: 140 meq/L (ref 135–145)
Total Bilirubin: 0.3 mg/dL (ref 0.2–1.2)
Total Protein: 7.1 g/dL (ref 6.0–8.3)

## 2024-09-07 LAB — HEMOGLOBIN A1C: Hgb A1c MFr Bld: 5.4 % (ref 4.6–6.5)

## 2024-09-07 LAB — TSH: TSH: 2.75 u[IU]/mL (ref 0.35–5.50)

## 2024-09-07 LAB — VITAMIN D 25 HYDROXY (VIT D DEFICIENCY, FRACTURES): VITD: 40.72 ng/mL (ref 30.00–100.00)

## 2024-09-07 MED ORDER — LEVOTHYROXINE SODIUM 100 MCG PO TABS: 100.0000 ug | ORAL_TABLET | Freq: Every day | ORAL | 3 refills | Status: DC

## 2024-09-07 MED ORDER — AMLODIPINE BESYLATE 5 MG PO TABS: 5.0000 mg | ORAL_TABLET | Freq: Every day | ORAL | 3 refills | Status: AC

## 2024-09-07 MED ORDER — MELOXICAM 15 MG PO TABS: 15.0000 mg | ORAL_TABLET | Freq: Every day | ORAL | 0 refills | Status: AC

## 2024-09-07 NOTE — Assessment & Plan Note (Signed)
 No red flags.  This has been going on for quite a while.  He has tried using that arch support without much improvement.  We discussed conservative measures as above.  He should hopefully have some improvement with above meloxicam.  Also discussed importance of good arch support and recommended he try a different type of inserts.  He may benefit from custom inserts however he will try over-the-counter right knee first.  We discussed home exercise program and handout was given.  He will let us  know if not improving in the next several weeks and would consider referral at that time.

## 2024-09-07 NOTE — Patient Instructions (Addendum)
 It was very nice to see you today!  VISIT SUMMARY: You came in for a routine visit to check your thyroid  and testosterone  levels. We also discussed your blood pressure, hand pain, and plantar fasciitis. Blood work was ordered, and medications were refilled.  YOUR PLAN: HYPOTHYROIDISM: Your thyroid  condition is managed with Synthroid , and you reported mild fatigue. -Continue taking Synthroid  100 mcg daily. -Annual thyroid  function tests are important. A thyroid  function test is ordered as part of your blood work.  ESSENTIAL HYPERTENSION: Your blood pressure is well-controlled with your current medication. -Continue taking Amlodipine  5 mg daily. -Monitor your blood pressure periodically at home.  LEFT HAND PAIN DUE TO LIKELY TENDINITIS: You have pain in your right hand, likely due to tendinitis. -Use Voltaren  gel consistently 3-4 times daily for several weeks. -Take Meloxicam for 1-2 weeks as prescribed.  PLANTAR FASCIITIS: You have pain in your foot, consistent with plantar fasciitis. -Use arch support insoles. -Follow the exercises and stretches provided in the handout. -Take Meloxicam for 1-2 weeks as prescribed. -Consider seeing a podiatrist or sports medicine specialist if symptoms persist.  GENERAL HEALTH MAINTENANCE: Routine wellness and preventive care. -Comprehensive blood work, including thyroid  and testosterone  levels, is ordered. -Monitor your blood pressure periodically at home. -Refilled Amlodipine  5 mg daily with 90 pills and a year's worth of refills. -Refilled Synthroid  100 mcg daily with 90 pills and a year's worth of refills.  Return if symptoms worsen or fail to improve.   Take care, Dr Kennyth  PLEASE NOTE:  If you had any lab tests, please let us  know if you have not heard back within a few days. You may see your results on mychart before we have a chance to review them but we will give you a call once they are reviewed by us .   If we ordered any referrals  today, please let us  know if you have not heard from their office within the next week.   If you had any urgent prescriptions sent in today, please check with the pharmacy within an hour of our visit to make sure the prescription was transmitted appropriately.   Please try these tips to maintain a healthy lifestyle:  Eat at least 3 REAL meals and 1-2 snacks per day.  Aim for no more than 5 hours between eating.  If you eat breakfast, please do so within one hour of getting up.   Each meal should contain half fruits/vegetables, one quarter protein, and one quarter carbs (no bigger than a computer mouse)  Cut down on sweet beverages. This includes juice, soda, and sweet tea.   Drink at least 1 glass of water with each meal and aim for at least 8 glasses per day  Exercise at least 150 minutes every week.

## 2024-09-07 NOTE — Assessment & Plan Note (Signed)
Check vitamin D with labs.

## 2024-09-07 NOTE — Progress Notes (Signed)
 Ryan Lam is a 48 y.o. male who presents today for an office visit.  Assessment/Plan:  New/Acute Problems: Hand Pain  No red flags.  Mild point tenderness on exam to distal aspect of third metacarpal.  May have small amount of tendinitis.  May also have small amount of underlying osteoarthritis.  We discussed treatment options.  Will try short course of meloxicam.  Is also okay for him to use over-the-counter product such as Voltaren .  He will let us  know if not improving in the next couple of weeks he would consider imaging versus referral at that time  Chronic Problems Addressed Today: Plantar fasciitis No red flags.  This has been going on for quite a while.  He has tried using that arch support without much improvement.  We discussed conservative measures as above.  He should hopefully have some improvement with above meloxicam.  Also discussed importance of good arch support and recommended he try a different type of inserts.  He may benefit from custom inserts however he will try over-the-counter right knee first.  We discussed home exercise program and handout was given.  He will let us  know if not improving in the next several weeks and would consider referral at that time.  Hypothyroidism On Synthroid  100 mcg daily.  Check TSH.  Testosterone  deficiency Check testosterone   Mixed hyperlipidemia Check lipids.  Vitamin D  deficiency Check vitamin D  with labs.  Essential hypertension At goal today on amlodipine  5 mg daily.  Hydrocele His pain and swelling improved significantly after recent course of systemic steroids for contact dermatitis earlier this year.  He will let us  know if he has any recurrence.     Subjective:  HPI:  See assessment / plan for status of chronic conditions.    Discussed the use of AI scribe software for clinical note transcription with the patient, who gave verbal consent to proceed.  History of Present Illness Ryan Lam  is a 48 year old male who presents for blood work to check thyroid  and testosterone  levels.  He has noticed fluctuations in his testosterone  levels over the past year, with an initial increase followed by a decrease. He is also interested in checking his blood sugar and cholesterol levels.  He experiences daily fatigue around 3 PM, which is manageable with caffeine. He is on a stable dose of thyroid  medication. He experiences some mild fatigue around 3 PM, which is manageable with caffeine.  He is on a medium dose of amlodipine  for blood pressure management and has a blood pressure cuff at home, although he needs to monitor it more regularly.  He has been experiencing hand pain for about 1.5 to 2 months, initially triggered by picking up a heavy object. The pain is described as a burning sensation in the knuckle of his middle finger, sometimes radiating down the hand. It has improved but still occurs with heavy lifting or pressure. He has tried Voltaren  gel inconsistently.  He has a history of a hydrocele, which improved significantly after taking a steroid for a separate issue. He also reports symptoms of plantar fasciitis in his left foot, primarily in the mornings, and has been using insoles for arch support. He experiences achy feet at night but does not require pain medication. He is concerned about the condition worsening.         Objective:  Physical Exam: BP 138/88   Pulse 78   Temp 97.9 F (36.6 C) (Temporal)   Resp 16  Ht 5' 11 (1.803 m)   Wt 187 lb 6.4 oz (85 kg)   SpO2 98%   BMI 26.14 kg/m   Gen: No acute distress, resting comfortably MUSCULOSKELETAL - Left Hand: No deformities.  Tenderness to palpation along distal aspect of third metacarpal.  Slight bony hypertrophy noted.  No pain elicited with axial loading of the third digit.  No pain with resisted extension or flexion of third digit. Neuro: Grossly normal, moves all extremities Psych: Normal affect and thought  content      Ryan Mansouri M. Kennyth, MD 09/07/2024 2:14 PM

## 2024-09-07 NOTE — Assessment & Plan Note (Signed)
On Synthroid 100 mcg daily.  Check TSH. 

## 2024-09-07 NOTE — Assessment & Plan Note (Signed)
Check testosterone. 

## 2024-09-07 NOTE — Assessment & Plan Note (Signed)
 At goal today on amlodipine  5 mg daily.

## 2024-09-07 NOTE — Assessment & Plan Note (Signed)
 His pain and swelling improved significantly after recent course of systemic steroids for contact dermatitis earlier this year.  He will let us  know if he has any recurrence.

## 2024-09-07 NOTE — Assessment & Plan Note (Signed)
 Check lipids

## 2024-09-09 ENCOUNTER — Ambulatory Visit: Payer: Self-pay | Admitting: Family Medicine

## 2024-09-09 NOTE — Progress Notes (Signed)
 His testosterone  is low but similar to the last few years.  As we have discussed before if he is interested in replacement we can refer him to endocrinology or urology.  His triglycerides are mildly elevated.  We can recheck this again in a year.  All of his other labs are at goal and we can recheck in a year.  He should continue to work on diet and exercise.

## 2024-11-13 ENCOUNTER — Other Ambulatory Visit: Payer: Self-pay | Admitting: Family Medicine
